# Patient Record
Sex: Male | Born: 1970 | Race: White | Hispanic: No | Marital: Married | Smoking: Never smoker
Health system: Southern US, Community
[De-identification: ages and names within clinical notes are randomized; demographics above are authoritative.]

## PROBLEM LIST (undated history)

## (undated) DIAGNOSIS — M542 Cervicalgia: Secondary | ICD-10-CM

## (undated) DIAGNOSIS — E7800 Pure hypercholesterolemia, unspecified: Secondary | ICD-10-CM

## (undated) DIAGNOSIS — F419 Anxiety disorder, unspecified: Secondary | ICD-10-CM

## (undated) DIAGNOSIS — F32A Depression, unspecified: Secondary | ICD-10-CM

## (undated) DIAGNOSIS — E119 Type 2 diabetes mellitus without complications: Secondary | ICD-10-CM

## (undated) DIAGNOSIS — I10 Essential (primary) hypertension: Secondary | ICD-10-CM

## (undated) HISTORY — PX: HAND SURGERY: SHX662

## (undated) HISTORY — PX: SHOULDER ARTHROSCOPY: SHX128

## (undated) HISTORY — PX: KNEE ARTHROSCOPY: SUR90

---

## 2008-11-09 ENCOUNTER — Ambulatory Visit: Payer: Self-pay | Admitting: Orthopedic Surgery

## 2013-04-28 ENCOUNTER — Ambulatory Visit: Payer: Self-pay | Admitting: Orthopedic Surgery

## 2014-12-13 ENCOUNTER — Ambulatory Visit: Payer: Self-pay | Admitting: Podiatry

## 2016-11-02 ENCOUNTER — Encounter: Payer: Self-pay | Admitting: Emergency Medicine

## 2016-11-02 ENCOUNTER — Emergency Department
Admission: EM | Admit: 2016-11-02 | Discharge: 2016-11-03 | Disposition: A | Discharge status: Home / Self Care | Payer: BLUE CROSS/BLUE SHIELD | Attending: Emergency Medicine | Admitting: Emergency Medicine

## 2016-11-02 ENCOUNTER — Emergency Department: Payer: BLUE CROSS/BLUE SHIELD

## 2016-11-02 DIAGNOSIS — Y999 Unspecified external cause status: Secondary | ICD-10-CM | POA: Insufficient documentation

## 2016-11-02 DIAGNOSIS — S60221A Contusion of right hand, initial encounter: Secondary | ICD-10-CM | POA: Insufficient documentation

## 2016-11-02 DIAGNOSIS — S63601A Unspecified sprain of right thumb, initial encounter: Secondary | ICD-10-CM | POA: Diagnosis not present

## 2016-11-02 DIAGNOSIS — S99911A Unspecified injury of right ankle, initial encounter: Secondary | ICD-10-CM | POA: Diagnosis present

## 2016-11-02 DIAGNOSIS — Y929 Unspecified place or not applicable: Secondary | ICD-10-CM | POA: Insufficient documentation

## 2016-11-02 DIAGNOSIS — X500XXA Overexertion from strenuous movement or load, initial encounter: Secondary | ICD-10-CM | POA: Insufficient documentation

## 2016-11-02 DIAGNOSIS — S82891A Other fracture of right lower leg, initial encounter for closed fracture: Secondary | ICD-10-CM

## 2016-11-02 DIAGNOSIS — S8254XA Nondisplaced fracture of medial malleolus of right tibia, initial encounter for closed fracture: Secondary | ICD-10-CM | POA: Diagnosis not present

## 2016-11-02 DIAGNOSIS — Y9389 Activity, other specified: Secondary | ICD-10-CM | POA: Diagnosis not present

## 2016-11-02 MED ORDER — TRAMADOL HCL 50 MG PO TABS
ORAL_TABLET | ORAL | Status: AC
Start: 2016-11-02 — End: 2016-11-03
  Administered 2016-11-03: 50 mg via ORAL
  Filled 2016-11-02: qty 1

## 2016-11-02 MED ORDER — TRAMADOL HCL 50 MG PO TABS
50.0000 mg | ORAL_TABLET | Freq: Once | ORAL | Status: AC
  Administered 2016-11-02: 50 mg via ORAL

## 2016-11-02 NOTE — ED Provider Notes (Signed)
[facility] Emergency Department Provider Note   ____________________________________________   I have reviewed the triage vital signs and the nursing notes.   HISTORY  Chief Complaint Hand Injury    HPI Vernon Burke is a 46 y.o. male presents with right thumb, hand and right ankle pain after breaking up a dog fight earlier this evening. Patient described his thumb hyperextending due to the weight of the dog. Once the fight ceased, he manually distracted his thumb although he denied noting it was dislocated. Patient noted while checking into the emergency department right anterior and medial ankle pain. He does not recall injury or twisting the ankle. Ambulatory at home and when he arrived at the emergency department.    History reviewed. No pertinent past medical history.  There are no active problems to display for this patient.   History reviewed. No pertinent surgical history.  Prior to Admission medications   Medication Sig Start Date End Date Taking? Authorizing Provider  traMADol (ULTRAM) 50 MG tablet Take 1 tablet (50 mg total) by mouth every 6 (six) hours as needed. 11/03/16 11/03/17  Tamecka Milham, Jordan Likesraci M, PA-C    Allergies Patient has no known allergies.  History reviewed. No pertinent family history.  Social History Social History  Substance Use Topics  . Smoking status: Never Smoker  . Smokeless tobacco: Never Used  . Alcohol use Yes    Review of Systems Constitutional: Negative for fever/chills Eyes: No visual changes. Cardiovascular: Denies chest pain. Respiratory: Denies shortness of breath. Gastrointestinal: No abdominal pain.  No nausea, vomiting, diarrhea. Genitourinary: Negative for dysuria. Musculoskeletal: Right thumb and hand pain. Right ankle pain. Skin: Negative for rash. Neurological: Negative for headaches.  Negative focal weakness or numbness. ____________________________________________   PHYSICAL  EXAM:  VITAL SIGNS: ED Triage Vitals  Enc Vitals Group     BP 11/02/16 2146 (!) 139/112     Pulse Rate 11/02/16 2146 90     Resp 11/02/16 2146 18     Temp 11/02/16 2146 98.1 F (36.7 C)     Temp Source 11/02/16 2146 Oral     SpO2 11/02/16 2146 96 %     Weight 11/02/16 2146 220 lb (99.8 kg)     Height 11/02/16 2146 6\' 1"  (1.854 m)     Head Circumference --      Peak Flow --      Pain Score 11/02/16 2145 9     Pain Loc --      Pain Edu? --      Excl. in GC? --     Constitutional: Alert and oriented. Well appearing and in no acute distress.  Head: Normocephalic and atraumatic. Eyes: Conjunctivae are normal. Cardiovascular: Normal rate, regular rhythm. Normal distal pulses. Respiratory: Normal respiratory effort.  Gastrointestinal: Soft and nontender. No distention. Musculoskeletal: Effusion along lateral hand. Right thumb pain, ROM limited by pain with sensation intact. No deformities noted. Right ankle pain, increases with weight-bearing. TTP along talus, navicular and medial malleoli. Right ankle ROM intact however with pain. Lateral ligamentous stability intact. Medial too painful for test.  Neurologic: Normal speech and language. No gross focal neurologic deficits are appreciated. Antalgic gait.  Skin:  Skin is warm, dry and intact. No rash noted. Psychiatric: Mood and affect are normal. Patient exhibits appropriate insight and judgment. ____________________________________________   LABS (all labs ordered are listed, but only abnormal results are displayed)  Labs Reviewed - No data to display ____________________________________________  EKG none  ____________________________________________  RADIOLOGY  DG right hand FINDINGS: No acute fracture or dislocation. Joint spaces maintained. Osseous mineralization normal. Punctate radiopaque density noted at the ulnar aspect of the right first proximal phalanx, which may reflect a small retained foreign body. Soft tissues  otherwise unremarkable.  IMPRESSION: 1. No acute fracture or dislocation. 2. Punctate radiopaque density within the soft tissues at the ulnar aspect of the right first proximal phalanx, which may reflect a tiny retained foreign body. Correlation with physical exam recommended.  DG right ankle  ____________________________________________   PROCEDURES  Procedure(s) performed: SPLINT APPLICATION Date/Time: 12:19 AM Authorized by: Clois Comber Consent: Verbal consent obtained. Risks and benefits: risks, benefits and alternatives were discussed Consent given by: patient Splint applied by: EMT technician Location details: right wrist Splint type: thumb spica velcro splint Supplies used: thumb spica wrist splint Post-procedure: The splinted body part was neurovascularly unchanged following the procedure. Patient tolerance: Patient tolerated the procedure well with no immediate complications.   Patient deferred splint for right ankle. He and his wife stated they would purchase OTC ankle brace until Orthopedic follow-up. Ace wrap applied for swelling management.    Critical Care performed: no ____________________________________________   INITIAL IMPRESSION / ASSESSMENT AND PLAN / ED COURSE  Pertinent labs & imaging results that were available during my care of the patient were reviewed by me and considered in my medical decision making (see chart for details).  Patient symptoms consistent with right thumb [place] and right ankle medial malleoli avulsion fracture following breaking up a dog fight. No neurological deficits noted in either injury. Physical exam findings and imaging confirmed injuries and supported management described. Right thumb sprain immobilized with a thumb spica splint. Neurovascular was intact following splint application. Discussed right ankle splint and support options with patient, he deferred splint and stated he would by an ankle brace over-the-counter.  Ace wrap applied for swelling management. Provided information on the type of ankle brace that would support injury until he followed up with orthopedics next week. Patient and wife verbalized understanding of the type of ankle brace needed for his injury. Recommended patient utilize prescription for pain management unless pain was less otherwise using over-the-counter NSAIDs and RICE for symptom management.   Patient / Family informed of clinical course, understand medical decision-making process, and agree with plan.  Patient was advised to follow up orthopedics and was also advised to return to the emergency department for symptoms that change or worsen.     ____________________________________________   FINAL CLINICAL IMPRESSION(S) / ED DIAGNOSES  Final diagnoses:  Contusion of right hand, initial encounter  Sprain of right thumb, unspecified site of finger, initial encounter  Avulsion fracture of right ankle, closed, initial encounter       NEW MEDICATIONS STARTED DURING THIS VISIT:  New Prescriptions   TRAMADOL (ULTRAM) 50 MG TABLET    Take 1 tablet (50 mg total) by mouth every 6 (six) hours as needed.     Note:  This document was prepared using Dragon voice recognition software and may include unintentional dictation errors.   Clois Comber, PA-C 11/03/16 0019    Darci Current, MD 11/04/16 1115

## 2016-11-02 NOTE — ED Triage Notes (Signed)
Pt ambulatory to triage in NAD, report tried to break up dog fight and right thumb got hyperextended.  No broken skin.

## 2016-11-03 MED ORDER — TRAMADOL HCL 50 MG PO TABS: 50.0000 mg | ORAL_TABLET | Freq: Four times a day (QID) | ORAL | 0 refills | Status: AC | PRN

## 2016-11-03 MED ORDER — TRAMADOL HCL 50 MG PO TABS
50.0000 mg | ORAL_TABLET | Freq: Once | ORAL | Status: AC
  Administered 2016-11-03: 50 mg via ORAL
  Filled 2016-11-03: qty 1

## 2016-11-03 NOTE — Discharge Instructions (Addendum)
If you have established care with Dr Rosita KeaMenz, call Monday to schedule an appointment.

## 2016-11-03 NOTE — ED Notes (Signed)
Pt's wife here to take pt home 

## 2018-01-15 ENCOUNTER — Encounter
Admission: RE | Admit: 2018-01-15 | Discharge: 2018-01-15 | Disposition: A | Discharge status: Home / Self Care | Payer: Worker's Compensation | Source: Ambulatory Visit | Attending: Orthopedic Surgery | Admitting: Orthopedic Surgery

## 2018-01-15 ENCOUNTER — Other Ambulatory Visit: Payer: Self-pay

## 2018-01-15 NOTE — Patient Instructions (Signed)
Your procedure is scheduled on:01/19/18 Report to Day Surgery. [facility]OND FLOOR To find out your arrival time please call [phone] between 1PM - 3PM on 01/16/18  Remember: Instructions that are not followed completely may result in serious medical risk,  up to and including death, or upon the discretion of your surgeon and anesthesiologist your  surgery may need to be rescheduled.     _X__ 1. Do not eat food after midnight the night before your procedure.                 No gum chewing or hard candies. You may drink clear liquids up to 2 hours                 before you are scheduled to arrive for your surgery- DO not drink clear                 liquids within 2 hours of the start of your surgery.                 Clear Liquids include:  water, apple juice without pulp, clear carbohydrate                 drink such as Clearfast of Gatorade, Black Coffee or Tea (Do not add                 anything to coffee or tea).  __X__2.  On the morning of surgery brush your teeth with toothpaste and water, you                may rinse your mouth with mouthwash if you wish.  Do not swallow any toothpaste of mouthwash.     _X__ 3.  No Alcohol for 24 hours before or after surgery.   _X__ 4.  Do Not Smoke or use e-cigarettes For 24 Hours Prior to Your Surgery.                 Do not use any chewable tobacco products for at least 6 hours prior to                 surgery.  ____  5.  Bring all medications with you on the day of surgery if instructed.   __X__  6.  Notify your doctor if there is any change in your medical condition      (cold, fever, infections).     Do not wear jewelry, make-up, hairpins, clips or nail polish. Do not wear lotions, powders, or perfumes. You may wear deodorant. Do not shave 48 hours prior to surgery. Men may shave face and neck. Do not bring valuables to the hospital.    [facility] is not responsible for any belongings or  valuables.  Contacts, dentures or bridgework may not be worn into surgery. Leave your suitcase in the car. After surgery it may be brought to your room. For patients admitted to the hospital, discharge time is determined by your treatment team.   Patients discharged the day of surgery will not be allowed to drive home.   Please read over the following fact sheets that you were given:   Surgical Site Infection Prevention    ____ Take these medicines the morning of surgery with A SIP OF WATER:    1. NONE  2.   3.   4.  5.  6.  ____ Fleet Enema (as directed)   ____ Use CHG Soap as directed  ____ Use inhalers on the day of surgery  ____ Stop metformin 2 days prior to surgery    ____ Take 1/2 of usual insulin dose the night before surgery. No insulin the morning          of surgery.   ____ Stop Coumadin/Plavix/aspirin on  _X___ Stop Anti-inflammatories on  NO ADVIL UNTIL AFTER SURGERY    CAN TAKE TYLENOL ____ Stop supplements until after surgery.    ____ Bring C-Pap to the hospital.

## 2018-01-18 MED ORDER — CEFAZOLIN SODIUM-DEXTROSE 2-4 GM/100ML-% IV SOLN
2.0000 g | Freq: Once | INTRAVENOUS | Status: AC
  Administered 2018-01-19: 2 g via INTRAVENOUS

## 2018-01-19 ENCOUNTER — Ambulatory Visit: Payer: Worker's Compensation | Admitting: Anesthesiology

## 2018-01-19 ENCOUNTER — Encounter
Admission: RE | Disposition: A | Discharge status: Home / Self Care | Payer: Self-pay | Source: Ambulatory Visit | Attending: Orthopedic Surgery

## 2018-01-19 ENCOUNTER — Encounter: Payer: Self-pay | Admitting: Emergency Medicine

## 2018-01-19 ENCOUNTER — Other Ambulatory Visit: Payer: Self-pay

## 2018-01-19 ENCOUNTER — Ambulatory Visit
Admission: RE | Admit: 2018-01-19 | Discharge: 2018-01-19 | Disposition: A | Discharge status: Home / Self Care | Payer: Worker's Compensation | Source: Ambulatory Visit | Attending: Orthopedic Surgery | Admitting: Orthopedic Surgery

## 2018-01-19 DIAGNOSIS — M7542 Impingement syndrome of left shoulder: Secondary | ICD-10-CM | POA: Insufficient documentation

## 2018-01-19 DIAGNOSIS — Y9389 Activity, other specified: Secondary | ICD-10-CM | POA: Insufficient documentation

## 2018-01-19 DIAGNOSIS — Z79891 Long term (current) use of opiate analgesic: Secondary | ICD-10-CM | POA: Diagnosis not present

## 2018-01-19 DIAGNOSIS — S46012A Strain of muscle(s) and tendon(s) of the rotator cuff of left shoulder, initial encounter: Secondary | ICD-10-CM | POA: Diagnosis not present

## 2018-01-19 DIAGNOSIS — X58XXXA Exposure to other specified factors, initial encounter: Secondary | ICD-10-CM | POA: Insufficient documentation

## 2018-01-19 DIAGNOSIS — M7522 Bicipital tendinitis, left shoulder: Secondary | ICD-10-CM | POA: Diagnosis not present

## 2018-01-19 DIAGNOSIS — Y990 Civilian activity done for income or pay: Secondary | ICD-10-CM | POA: Diagnosis not present

## 2018-01-19 DIAGNOSIS — Z791 Long term (current) use of non-steroidal anti-inflammatories (NSAID): Secondary | ICD-10-CM | POA: Diagnosis not present

## 2018-01-19 DIAGNOSIS — S43432A Superior glenoid labrum lesion of left shoulder, initial encounter: Secondary | ICD-10-CM | POA: Insufficient documentation

## 2018-01-19 DIAGNOSIS — M19012 Primary osteoarthritis, left shoulder: Secondary | ICD-10-CM | POA: Insufficient documentation

## 2018-01-19 HISTORY — PX: BICEPT TENODESIS: SHX5116

## 2018-01-19 SURGERY — SHOULDER ARTHROSCOPY WITH SUBACROMIAL DECOMPRESSION AND DISTAL CLAVICLE EXCISION
Anesthesia: General | Site: Shoulder | Laterality: Left | Wound class: Clean

## 2018-01-19 MED ORDER — BUPIVACAINE LIPOSOME 1.3 % IJ SUSP
INTRAMUSCULAR | Status: DC | PRN
  Administered 2018-01-19: 20 mL via PERINEURAL

## 2018-01-19 MED ORDER — PROPOFOL 10 MG/ML IV BOLUS
INTRAVENOUS | Status: AC
  Filled 2018-01-19: qty 20

## 2018-01-19 MED ORDER — ROCURONIUM BROMIDE 100 MG/10ML IV SOLN
INTRAVENOUS | Status: DC | PRN
  Administered 2018-01-19: 15 mg via INTRAVENOUS
  Administered 2018-01-19: 5 mg via INTRAVENOUS
  Administered 2018-01-19: 30 mg via INTRAVENOUS

## 2018-01-19 MED ORDER — FENTANYL CITRATE (PF) 100 MCG/2ML IJ SOLN
50.0000 ug | Freq: Once | INTRAMUSCULAR | Status: AC
  Administered 2018-01-19: 50 ug via INTRAVENOUS

## 2018-01-19 MED ORDER — NEOMYCIN-POLYMYXIN B GU 40-200000 IR SOLN
Status: DC | PRN
  Administered 2018-01-19: 2 mL

## 2018-01-19 MED ORDER — FAMOTIDINE 20 MG PO TABS
20.0000 mg | ORAL_TABLET | Freq: Once | ORAL | Status: AC
  Administered 2018-01-19: 20 mg via ORAL

## 2018-01-19 MED ORDER — MEPERIDINE HCL 50 MG/ML IJ SOLN: 6.2500 mg | INTRAMUSCULAR | Status: DC | PRN

## 2018-01-19 MED ORDER — FENTANYL CITRATE (PF) 100 MCG/2ML IJ SOLN
INTRAMUSCULAR | Status: DC | PRN
  Administered 2018-01-19 (×2): 50 ug via INTRAVENOUS
  Administered 2018-01-19: 100 ug via INTRAVENOUS

## 2018-01-19 MED ORDER — BUPIVACAINE HCL (PF) 0.5 % IJ SOLN
INTRAMUSCULAR | Status: AC
  Filled 2018-01-19: qty 10

## 2018-01-19 MED ORDER — FENTANYL CITRATE (PF) 100 MCG/2ML IJ SOLN
INTRAMUSCULAR | Status: AC
  Filled 2018-01-19: qty 2

## 2018-01-19 MED ORDER — OXYCODONE HCL 5 MG PO TABS: 5.0000 mg | ORAL_TABLET | ORAL | 0 refills | Status: DC | PRN

## 2018-01-19 MED ORDER — LIDOCAINE HCL (PF) 1 % IJ SOLN
INTRAMUSCULAR | Status: AC
  Filled 2018-01-19: qty 30

## 2018-01-19 MED ORDER — ONDANSETRON HCL 4 MG/2ML IJ SOLN
INTRAMUSCULAR | Status: AC
  Filled 2018-01-19: qty 2

## 2018-01-19 MED ORDER — LIDOCAINE HCL (PF) 2 % IJ SOLN
INTRAMUSCULAR | Status: AC
  Filled 2018-01-19: qty 10

## 2018-01-19 MED ORDER — ACETAMINOPHEN 500 MG PO TABS: 1000.0000 mg | ORAL_TABLET | Freq: Three times a day (TID) | ORAL | 2 refills | Status: DC

## 2018-01-19 MED ORDER — SODIUM CHLORIDE FLUSH 0.9 % IV SOLN
INTRAVENOUS | Status: AC
  Filled 2018-01-19: qty 10

## 2018-01-19 MED ORDER — LIDOCAINE HCL (CARDIAC) PF 100 MG/5ML IV SOSY
PREFILLED_SYRINGE | INTRAVENOUS | Status: DC | PRN
  Administered 2018-01-19: 100 mg via INTRAVENOUS

## 2018-01-19 MED ORDER — LIDOCAINE HCL (PF) 1 % IJ SOLN
INTRAMUSCULAR | Status: DC | PRN
  Administered 2018-01-19: 3 mL

## 2018-01-19 MED ORDER — MIDAZOLAM HCL 2 MG/2ML IJ SOLN
1.0000 mg | Freq: Once | INTRAMUSCULAR | Status: AC
  Administered 2018-01-19: 1 mg via INTRAVENOUS

## 2018-01-19 MED ORDER — LIDOCAINE HCL (PF) 1 % IJ SOLN
INTRAMUSCULAR | Status: AC
  Filled 2018-01-19: qty 5

## 2018-01-19 MED ORDER — SUCCINYLCHOLINE CHLORIDE 20 MG/ML IJ SOLN
INTRAMUSCULAR | Status: DC | PRN
  Administered 2018-01-19: 100 mg via INTRAVENOUS

## 2018-01-19 MED ORDER — ONDANSETRON HCL 4 MG/2ML IJ SOLN
INTRAMUSCULAR | Status: DC | PRN
  Administered 2018-01-19: 4 mg via INTRAVENOUS

## 2018-01-19 MED ORDER — LACTATED RINGERS IV SOLN
INTRAVENOUS | Status: DC | PRN
  Administered 2018-01-19: 5 mL

## 2018-01-19 MED ORDER — LIDOCAINE HCL (PF) 1 % IJ SOLN
INTRAMUSCULAR | Status: DC | PRN
  Administered 2018-01-19: 10 mL

## 2018-01-19 MED ORDER — BUPIVACAINE-EPINEPHRINE (PF) 0.5% -1:200000 IJ SOLN
INTRAMUSCULAR | Status: DC | PRN
  Administered 2018-01-19: 3 mL

## 2018-01-19 MED ORDER — PROMETHAZINE HCL 25 MG/ML IJ SOLN
INTRAMUSCULAR | Status: AC
  Administered 2018-01-19: 12.5 mg via INTRAVENOUS
  Filled 2018-01-19: qty 1

## 2018-01-19 MED ORDER — SUGAMMADEX SODIUM 200 MG/2ML IV SOLN
INTRAVENOUS | Status: AC
  Filled 2018-01-19: qty 2

## 2018-01-19 MED ORDER — BUPIVACAINE LIPOSOME 1.3 % IJ SUSP
INTRAMUSCULAR | Status: AC
  Filled 2018-01-19: qty 20

## 2018-01-19 MED ORDER — SUGAMMADEX SODIUM 200 MG/2ML IV SOLN
INTRAVENOUS | Status: DC | PRN
  Administered 2018-01-19: 200 mg via INTRAVENOUS

## 2018-01-19 MED ORDER — PROPOFOL 10 MG/ML IV BOLUS
INTRAVENOUS | Status: DC | PRN
  Administered 2018-01-19: 200 mg via INTRAVENOUS

## 2018-01-19 MED ORDER — ASPIRIN EC 325 MG PO TBEC: 325.0000 mg | DELAYED_RELEASE_TABLET | Freq: Every day | ORAL | 0 refills | Status: AC

## 2018-01-19 MED ORDER — CEFAZOLIN SODIUM-DEXTROSE 2-4 GM/100ML-% IV SOLN
INTRAVENOUS | Status: AC
  Filled 2018-01-19: qty 100

## 2018-01-19 MED ORDER — ROCURONIUM BROMIDE 50 MG/5ML IV SOLN
INTRAVENOUS | Status: AC
  Filled 2018-01-19: qty 1

## 2018-01-19 MED ORDER — BUPIVACAINE HCL (PF) 0.5 % IJ SOLN
INTRAMUSCULAR | Status: DC | PRN
  Administered 2018-01-19: 10 mL via PERINEURAL

## 2018-01-19 MED ORDER — OXYCODONE HCL 5 MG PO TABS: 5.0000 mg | ORAL_TABLET | Freq: Once | ORAL | Status: DC | PRN

## 2018-01-19 MED ORDER — FENTANYL CITRATE (PF) 100 MCG/2ML IJ SOLN
INTRAMUSCULAR | Status: AC
  Administered 2018-01-19: 50 ug via INTRAVENOUS
  Filled 2018-01-19: qty 2

## 2018-01-19 MED ORDER — NEOMYCIN-POLYMYXIN B GU 40-200000 IR SOLN
Status: AC
  Filled 2018-01-19: qty 2

## 2018-01-19 MED ORDER — LACTATED RINGERS IV SOLN
INTRAVENOUS | Status: DC
  Administered 2018-01-19: 07:00:00 via INTRAVENOUS

## 2018-01-19 MED ORDER — EPINEPHRINE 30 MG/30ML IJ SOLN
INTRAMUSCULAR | Status: AC
  Filled 2018-01-19: qty 1

## 2018-01-19 MED ORDER — PROMETHAZINE HCL 25 MG/ML IJ SOLN
6.2500 mg | INTRAMUSCULAR | Status: DC | PRN
  Administered 2018-01-19: 12.5 mg via INTRAVENOUS

## 2018-01-19 MED ORDER — OXYCODONE HCL 5 MG/5ML PO SOLN: 5.0000 mg | Freq: Once | ORAL | Status: DC | PRN

## 2018-01-19 MED ORDER — FAMOTIDINE 20 MG PO TABS
ORAL_TABLET | ORAL | Status: AC
  Administered 2018-01-19: 20 mg via ORAL
  Filled 2018-01-19: qty 1

## 2018-01-19 MED ORDER — BUPIVACAINE-EPINEPHRINE (PF) 0.5% -1:200000 IJ SOLN
INTRAMUSCULAR | Status: AC
  Filled 2018-01-19: qty 30

## 2018-01-19 MED ORDER — MIDAZOLAM HCL 2 MG/2ML IJ SOLN
INTRAMUSCULAR | Status: AC
  Administered 2018-01-19: 1 mg via INTRAVENOUS
  Filled 2018-01-19: qty 2

## 2018-01-19 MED ORDER — ONDANSETRON 4 MG PO TBDP: 4.0000 mg | ORAL_TABLET | Freq: Three times a day (TID) | ORAL | 0 refills | Status: DC | PRN

## 2018-01-19 MED ORDER — SUCCINYLCHOLINE CHLORIDE 20 MG/ML IJ SOLN
INTRAMUSCULAR | Status: AC
  Filled 2018-01-19: qty 1

## 2018-01-19 MED ORDER — PHENYLEPHRINE HCL 10 MG/ML IJ SOLN
INTRAMUSCULAR | Status: AC
  Filled 2018-01-19: qty 1

## 2018-01-19 MED ORDER — FENTANYL CITRATE (PF) 100 MCG/2ML IJ SOLN: 25.0000 ug | INTRAMUSCULAR | Status: DC | PRN

## 2018-01-19 SURGICAL SUPPLY — 94 items
ADAPTER IRRIG TUBE 2 SPIKE SOL (ADAPTER) ×4 IMPLANT
ADH SKN CLS APL DERMABOND .7 (GAUZE/BANDAGES/DRESSINGS) ×2
ADPR TBG 2 SPK PMP STRL ASCP (ADAPTER)
ADPR TBG Y 2 SPK STRL DISP CNT (ADAPTER) ×2
ANCH SUT Q-FX 2.8 (Anchor) ×2 IMPLANT
ANCH SUT RGNRT REGENETEN (Staple) ×2 IMPLANT
ANCHOR ALL-SUT Q-FIX 2.8 (Anchor) ×2 IMPLANT
ANCHOR TENDON REGENETEN (Staple) ×2 IMPLANT
ANCHORS BONE REGENETEN (Anchor) ×2 IMPLANT
BLADE SURG 15 STRL LF DISP TIS (BLADE) IMPLANT
BLADE SURG 15 STRL SS (BLADE) ×4
BRUSH SCRUB EZ  4% CHG (MISCELLANEOUS)
BRUSH SCRUB EZ 4% CHG (MISCELLANEOUS) ×2 IMPLANT
BUR RADIUS 4.0X18.5 (BURR) ×2 IMPLANT
BUR RADIUS 5.5 (BURR) ×2 IMPLANT
BURR OVAL 8 FLU 5.0MM X 13CM (MISCELLANEOUS) ×1
BURR OVAL 8 FLU 5.0X13 (MISCELLANEOUS) ×1 IMPLANT
Burr Oval, 8 Flute ×2 IMPLANT
CANNULA 5.75X7 CRYSTAL CLEAR (CANNULA) ×4 IMPLANT
CANNULA PARTIAL THREAD 2X7 (CANNULA) ×4 IMPLANT
CANNULA TWIST IN 8.25X9CM (CANNULA) IMPLANT
CHLORAPREP W/TINT 26ML (MISCELLANEOUS) ×6 IMPLANT
CLOSURE WOUND 1/2 X4 (GAUZE/BANDAGES/DRESSINGS)
COOLER POLAR GLACIER W/PUMP (MISCELLANEOUS) ×4 IMPLANT
CRADLE LAMINECT ARM (MISCELLANEOUS) ×4 IMPLANT
CUTTER BONE 4.0MM X 13CM (MISCELLANEOUS) ×2 IMPLANT
DERMABOND ADVANCED (GAUZE/BANDAGES/DRESSINGS) ×2
DERMABOND ADVANCED .7 DNX12 (GAUZE/BANDAGES/DRESSINGS) IMPLANT
DRAPE IMP U-DRAPE 54X76 (DRAPES) ×8 IMPLANT
DRAPE INCISE IOBAN 66X45 STRL (DRAPES) ×4 IMPLANT
DRAPE SHEET LG 3/4 BI-LAMINATE (DRAPES) ×4 IMPLANT
DRAPE STERI 35X30 U-POUCH (DRAPES) ×4 IMPLANT
DRSG TEGADERM 4X4.75 (GAUZE/BANDAGES/DRESSINGS) ×6 IMPLANT
DW OUTFLOW CASSETTE/TUBE SET (MISCELLANEOUS) ×2 IMPLANT
ELECT REM PT RETURN 9FT ADLT (ELECTROSURGICAL)
ELECTRODE REM PT RTRN 9FT ADLT (ELECTROSURGICAL) IMPLANT
GAUZE PETRO XEROFOAM 1X8 (MISCELLANEOUS) ×2 IMPLANT
GAUZE SPONGE 4X4 12PLY STRL (GAUZE/BANDAGES/DRESSINGS) ×4 IMPLANT
GAUZE XEROFORM 4X4 STRL (GAUZE/BANDAGES/DRESSINGS) ×2 IMPLANT
GLOVE BIOGEL PI IND STRL 8 (GLOVE) ×2 IMPLANT
GLOVE BIOGEL PI INDICATOR 8 (GLOVE) ×6
GLOVE SURG ORTHO 8.0 STRL STRW (GLOVE) ×4 IMPLANT
GLOVE SURG SYN 7.5  E (GLOVE) ×2
GLOVE SURG SYN 7.5 E (GLOVE) ×2 IMPLANT
GLOVE SURG SYN 7.5 PF PI (GLOVE) ×2 IMPLANT
GOWN STRL REUS W/ TWL LRG LVL3 (GOWN DISPOSABLE) ×2 IMPLANT
GOWN STRL REUS W/TWL LRG LVL3 (GOWN DISPOSABLE) ×4
GOWN STRL REUS W/TWL LRG LVL4 (GOWN DISPOSABLE) ×10 IMPLANT
IMPLANT REGENETEN MEDIUM (Shoulder) ×2 IMPLANT
IV LACTATED RINGER IRRG 3000ML (IV SOLUTION) ×20
IV LR IRRIG 3000ML ARTHROMATIC (IV SOLUTION) ×8 IMPLANT
KIT STABILIZATION SHOULDER (MISCELLANEOUS) ×4 IMPLANT
KIT SUTURE 2.8 Q-FIX DISP (MISCELLANEOUS) ×2 IMPLANT
KIT SUTURETAK 3.0 INSERT PERC (KITS) ×2 IMPLANT
KIT TURNOVER KIT A (KITS) ×4 IMPLANT
MANIFOLD NEPTUNE II (INSTRUMENTS) ×4 IMPLANT
MASK FACE SPIDER DISP (MASK) ×4 IMPLANT
MAT BLUE FLOOR 46X72 FLO (MISCELLANEOUS) ×8 IMPLANT
NDL MAYO CATGUT SZ5 (NEEDLE) ×4
NDL SAFETY ECLIPSE 18X1.5 (NEEDLE) ×2 IMPLANT
NDL SUT 5 .5 CRC TPR PNT MAYO (NEEDLE) IMPLANT
NEEDLE HYPO 18GX1.5 SHARP (NEEDLE) ×4
NEEDLE HYPO 22GX1.5 SAFETY (NEEDLE) ×4 IMPLANT
PACK ARTHROSCOPY SHOULDER (MISCELLANEOUS) ×4 IMPLANT
PAD ABD DERMACEA PRESS 5X9 (GAUZE/BANDAGES/DRESSINGS) ×4 IMPLANT
PAD WRAPON POLAR SHDR XLG (MISCELLANEOUS) ×2 IMPLANT
PROBE APOLLO 90XL (SURGICAL WAND) ×2 IMPLANT
SET ADAPTER IRRIG ARTHO 72IN Y (ADAPTER) ×2 IMPLANT
SET TUBE SUCT SHAVER OUTFL 24K (TUBING) ×2 IMPLANT
SET TUBE TIP INTRA-ARTICULAR (MISCELLANEOUS) ×2 IMPLANT
SLING ULTRA II M (MISCELLANEOUS) ×4 IMPLANT
STRAP SAFETY 5IN WIDE (MISCELLANEOUS) ×4 IMPLANT
STRIP CLOSURE SKIN 1/2X4 (GAUZE/BANDAGES/DRESSINGS) ×2 IMPLANT
SUT ETHILON 3-0 FS-10 30 BLK (SUTURE) ×4
SUT ETHILON 4-0 (SUTURE) ×4
SUT ETHILON 4-0 FS2 18XMFL BLK (SUTURE) ×2
SUT LASSO 90 DEG SD STR (SUTURE) ×2 IMPLANT
SUT MNCRL 4-0 (SUTURE) ×4
SUT MNCRL 4-0 27XMFL (SUTURE) ×2
SUT PDS AB 0 CT1 27 (SUTURE) ×4 IMPLANT
SUTURE EHLN 3-0 FS-10 30 BLK (SUTURE) IMPLANT
SUTURE ETHLN 4-0 FS2 18XMF BLK (SUTURE) ×2 IMPLANT
SUTURE MNCRL 4-0 27XMF (SUTURE) ×2 IMPLANT
SYR 10ML LL (SYRINGE) ×4 IMPLANT
TAPE CLOTH 3X10 WHT NS LF (GAUZE/BANDAGES/DRESSINGS) ×4 IMPLANT
TAPE MICROFOAM 4IN (TAPE) ×4 IMPLANT
TUBING ARTHRO INFLOW-ONLY STRL (TUBING) ×2 IMPLANT
TUBING CONNECTING 10 (TUBING) ×3 IMPLANT
TUBING CONNECTING 10' (TUBING) ×1
TUBING REDEUCE PAT W/CON 8IN (MISCELLANEOUS) ×2 IMPLANT
TUBING REDEUCE PUMP W/CON 8IN (MISCELLANEOUS) ×2 IMPLANT
WAND HAND CNTRL MULTIVAC 90 (MISCELLANEOUS) IMPLANT
WRAPON POLAR PAD SHDR XLG (MISCELLANEOUS) ×4
burr oval 8 f ×1 IMPLANT

## 2018-01-19 NOTE — Anesthesia Procedure Notes (Signed)
Procedure Name: Intubation Performed by: Casey BurkittHoang, Nylene Inlow, CRNA Pre-anesthesia Checklist: Patient identified, Patient being monitored, Timeout performed, Emergency Drugs available and Suction available Patient Re-evaluated:Patient Re-evaluated prior to induction Oxygen Delivery Method: Circle system utilized Preoxygenation: Pre-oxygenation with 100% oxygen Induction Type: IV induction Ventilation: Mask ventilation without difficulty, Oral airway inserted - appropriate to patient size and Two handed mask ventilation required Laryngoscope Size: 3 and McGraph Grade View: Grade I Tube type: Oral Tube size: 7.5 mm Number of attempts: 1 Airway Equipment and Method: Stylet Placement Confirmation: ETT inserted through vocal cords under direct vision,  positive ETCO2 and breath sounds checked- equal and bilateral Secured at: 22 cm Tube secured with: Tape Dental Injury: Teeth and Oropharynx as per pre-operative assessment

## 2018-01-19 NOTE — Op Note (Signed)
SURGERY DATE: 01/19/2018  PRE-OP DIAGNOSIS:  1. Left subacromial impingement 2. Left biceps tendinopathy 3. Left partial thickness rotator cuff tear 4. Left acromioclavicular joint osteoarthritis  POST-OP DIAGNOSIS: 1. Left subacromial impingement 2. Left biceps tendinopathy 3. Left rotator cuff tear 4. Left acromioclavicular joint osteoarthritis  PROCEDURES:  1. Left mini-open rotator cuff repair with Regeneten patch 2. Left open biceps tenodesis 3. Left distal clavicle excision 4. Left extensive debridement of shoulder (glenohumeral and subacromial spaces) 5. Left subacromial decompression  SURGEON: Rosealee Albee, MD  ANESTHESIA: Gen with interscalene block w/Exparil  ESTIMATED BLOOD LOSS: 25cc  DRAINS:  none  TOTAL IV FLUIDS: per anesthesia   SPECIMENS: none  IMPLANTS:  - [employer] Regeneten patch with associated tendon and bone staples - [employer] Q-fix all-suture Anchor - x1  OPERATIVE FINDINGS:  Examination under anesthesia: A careful examination under anesthesia was performed.  Passive range of motion was: FF: 150; ER at side: 50; ER in abduction: 90; IR in abduction: 50.  Anterior load shift: NT.  Posterior load shift: NT.  Sulcus in neutral: NT.  Sulcus in ER: NT.    Intra-operative findings: A thorough arthroscopic examination of the shoulder was performed.  The findings are: 1. Biceps tendon: tendinopathy with erythema 2. Superior labrum: Type 2 SLAP tear 3. Posterior labrum and capsule: normal 4. Inferior capsule and inferior recess: normal 5. Glenoid cartilage surface: Normal 6. Supraspinatus attachment: partial thickness tearing of entire anterior to posterior aspect of supraspinatus involving ~50% of the articular surface 7. Posterior rotator cuff attachment: normal 8. Humeral head articular cartilage: normal 9. Rotator interval: normal 10: Subscapularis tendon: slight partial thickness tearing of superior fibers, no exposed lesser  tuberosity footprint 11. Anterior labrum: degenerative 12. IGHL: normal  OPERATIVE REPORT:   Indications for procedure: Vernon Burke is a 48 y.o. year old male with ~5 months of shoulder pain that began after he tried catching a heavy falling object at work. He developed adhesive capsulitis that has been treated appropriately. He has has failed nonoperative management including rest, activity modification, physical therapy, and/or corticosteroid injection. MRI showed a partial thickness rotator cuff tear with significant AC joint osteolysis. After discussion of risks, benefits, and alternatives to surgery, the patient elected to proceed with above mentioned procedure. The patient understands that use of the Regeneten patch is relatively new and long-term data is unknown.  Procedure in detail:  I identified WELTON BORD in the pre-operative holding area.  I marked the operative shoulder with my initials. I reviewed the risks and benefits of the proposed surgical intervention, and the patient (and/or patient's guardian) wished to proceed.  Anesthesia was then performed with an interscalene block with Exparil.  The patient was transferred to the operative suite and placed in the beach chair position.    SCDs were placed on the lower extremities. Appropriate IV antibiotics were administered. The operative upper extremity was then prepped and draped in standard fashion. A time out was performed confirming the correct extremity, correct patient and correct procedure.   I then created a standard posterior portal with an 11 blade. The glenohumeral joint was easily entered with a blunt trochar and the arthroscope introduced. The findings of diagnostic arthroscopy are described above. I debrided the degenerative anterior labrum and also debrided and coagulated the inflamed synovium to obtain hemostasis and reduce the risk of post-operative swelling using an Arthrocare radiofrequency device. A  spinal needle was placed in the anterior portion of the partial thickness  rotator cuff tear and an 0-PDS suture was passed through the needle and retrieved out of the anterior portal. This was repeated to mark the posterior position of the partial thickness rotator cuff tear.  I performed a biceps tenotomy using an arthroscopic scissors and used a motorized shaver to debride the stump back to a stable base.   Next, the arthroscope was then introduced into the subacromial space. A direct lateral portal was created with an 11-blade after spinal needle localization. An extensive subacromial bursectomy was performed using a combination of the shaver and Arthrocare wand. The entire acromial undersurface was exposed and the CA ligament was subperiosteally elevated to expose the prominent anterior acromial hook. A burr was used to create a flat anterior and lateral aspect of the acromion, converting it from a Type 2 to a Type 1 acromion. Care was made to keep the deltoid fascia intact.  Next, I exposed the acromioclavicular joint using a combination of shaver and arthrocare wand. The distal 66m of clavicle was removed using a 5.61mm burr (2 burr widths removed). Adequate resection was confirmed by placing the camera into the anterior portal. Care was taken to preserve the superior and posterior capsule. Hemostasis was achieved and fluid was evacuated from the shoulder.   A longitudinal incision from the anterolateral acromion ~6cm in length was made overlying the raphe between the anterior and middle heads of the deltoid. The raphe was identified and it was incised. The subacromial space was identified. Any remaining bursa was excised. We then turned our attention to the biceps tenodesis. The arm was externally rotated.  The bicipital groove was identified.  A 15 blade was used to make a cut overlying the biceps tendon, and the tendon was removed using a right angle clamp.  The base of the bicipital groove was  identified and cleared of soft tissue.  A Q fix anchor was placed in the bicipital groove.  The biceps tendon was held at the appropriate amount of tension.  One set of sutures was passed through the biceps anchor with one limb passed in a simple fashion and the second limb passed in a simple plus locking stitch pattern. This was repeated for the other set of sutures.  This construct allowed for shuttling the biceps tendon down to the bone.  The sutures were tied and cut.  The diseased portion of the proximal biceps was then excised.  The arm was then internally rotated.  The bursal side of the rotator cuff was intact except a small area of partial thickness tearing along the anterior supraspinatus. There was no full-thickness tear. The 0-PDS sutures marking the anterior and posterior medial aspects of the rotator cuff tear were identified. We decided to proceed with Regeneten patch placement. The Regeneten patch delivery gun was placed appropriately and the patch was delivered over the supraspinatus tendon. It was positioned such that both 0-PDS sutures were covered and the bursal sided partial-thickness rotator cuff tear were all covered. Tendon staples were placed medially, anteriorly, and posteriorly. Two bone staples was then placed laterally. The patch was then probed to confirm appropriate stability.   The wound was thoroughly irrigated.  The deltoid split was closed with 0 Vicryl.  The subdermal layer was closed with 2-0 Vicryl.  The skin was closed with 4-0 Monocryl and Dermabond. The portals were closed with 3-0 Nylon. Xeroform was applied to the portals. A sterile dressing was applied, followed by a Polar Care sleeve and a SlingShot shoulder immobilizer/sling. The patient awoke  from anesthesia without difficulty and was transferred to the PACU in stable condition.    COMPLICATIONS: none  DISPOSITION: plan for discharge home after recovery in PACU  POSTOPERATIVE PLAN: Remain in sling (except  hygiene and elbow/wrist/hand RoM exercises as instructed by PT) x 4 weeks and NWB for this time. PT to begin 3-4 days after surgery. Use biceps tenodesis and distal clavicle excision protocol as Regeneten patch protocol is too aggressive for biceps tenodesis healing.

## 2018-01-19 NOTE — Anesthesia Preprocedure Evaluation (Signed)
Anesthesia Evaluation  Patient identified by MRN, date of birth, ID band Patient awake    Reviewed: Allergy & Precautions, NPO status , Patient's Chart, lab work & pertinent test results  History of Anesthesia Complications Negative for: history of anesthetic complications  Airway Mallampati: II  TM Distance: >3 FB Neck ROM: Full    Dental  (+) Poor Dentition,    Pulmonary neg pulmonary ROS, neg sleep apnea, neg COPD,    breath sounds clear to auscultation- rhonchi (-) wheezing      Cardiovascular Exercise Tolerance: Good (-) hypertension(-) CAD and (-) Past MI  Rhythm:Regular Rate:Normal - Systolic murmurs and - Diastolic murmurs    Neuro/Psych negative neurological ROS  negative psych ROS   GI/Hepatic negative GI ROS, Neg liver ROS,   Endo/Other  negative endocrine ROSneg diabetes  Renal/GU negative Renal ROS     Musculoskeletal negative musculoskeletal ROS (+)   Abdominal (+) - obese,   Peds  Hematology negative hematology ROS (+)   Anesthesia Other Findings    Reproductive/Obstetrics                             Anesthesia Physical Anesthesia Plan  ASA: I  Anesthesia Plan: General   Post-op Pain Management:  Regional for Post-op pain   Induction: Intravenous  PONV Risk Score and Plan: 1 and Ondansetron and Midazolam  Airway Management Planned: Oral ETT  Additional Equipment:   Intra-op Plan:   Post-operative Plan: Extubation in OR  Informed Consent: I have reviewed the patients History and Physical, chart, labs and discussed the procedure including the risks, benefits and alternatives for the proposed anesthesia with the patient or authorized representative who has indicated his/her understanding and acceptance.   Dental advisory given  Plan Discussed with: CRNA and Anesthesiologist  Anesthesia Plan Comments:         Anesthesia Quick Evaluation

## 2018-01-19 NOTE — Transfer of Care (Signed)
Immediate Anesthesia Transfer of Care Note  Patient: Vernon LincolnJeffrey B Burke  Procedure(s) Performed: SHOULDER ARTHROSCOPY WITH SUBACROMIAL DECOMPRESSION, DISTAL CLAVICLE EXCISION,OPEN ROTATOR CUFF REPAIR AND REGENETEN PATCH APPLICATION. (Left Shoulder) BICEPS TENODESIS (Left )  Patient Location: PACU  Anesthesia Type:General  Level of Consciousness: sedated and responds to stimulation  Airway & Oxygen Therapy: Patient Spontanous Breathing and Patient connected to face mask oxygen  Post-op Assessment: Report given to RN and Post -op Vital signs reviewed and stable  Post vital signs: Reviewed and stable  Last Vitals:  Vitals Value Taken Time  BP 146/90 01/19/2018 11:26 AM  Temp 36.1 C 01/19/2018 11:24 AM  Pulse 86 01/19/2018 11:26 AM  Resp 23 01/19/2018 11:26 AM  SpO2 99 % 01/19/2018 11:26 AM    Last Pain:  Vitals:   01/19/18 0609  TempSrc: Tympanic  PainSc: 4          Complications: No apparent anesthesia complications

## 2018-01-19 NOTE — Anesthesia Post-op Follow-up Note (Signed)
Anesthesia QCDR form completed.        

## 2018-01-19 NOTE — Anesthesia Postprocedure Evaluation (Signed)
Anesthesia Post Note  Patient: Vernon Burke  Procedure(s) Performed: SHOULDER ARTHROSCOPY WITH SUBACROMIAL DECOMPRESSION, DISTAL CLAVICLE EXCISION,OPEN ROTATOR CUFF REPAIR AND REGENETEN PATCH APPLICATION. (Left Shoulder) BICEPS TENODESIS (Left )  Patient location during evaluation: PACU Anesthesia Type: General Level of consciousness: awake and alert and oriented Pain management: pain level controlled Vital Signs Assessment: post-procedure vital signs reviewed and stable Respiratory status: spontaneous breathing, nonlabored ventilation and respiratory function stable Cardiovascular status: blood pressure returned to baseline and stable Postop Assessment: no signs of nausea or vomiting Anesthetic complications: no     Last Vitals:  Vitals:   01/19/18 1254 01/19/18 1315  BP: (!) 132/91 (!) 149/93  Pulse: 87 81  Resp: 20 16  Temp: 36.6 C (!) 36.3 C  SpO2: 98% 100%    Last Pain:  Vitals:   01/19/18 1315  TempSrc: Temporal  PainSc: 0-No pain                 Alexa Blish

## 2018-01-19 NOTE — Anesthesia Procedure Notes (Signed)
Anesthesia Regional Block: Interscalene brachial plexus block   Pre-Anesthetic Checklist: ,, timeout performed, Correct Patient, Correct Site, Correct Laterality, Correct Procedure, Correct Position, site marked, Risks and benefits discussed,  Surgical consent,  Pre-op evaluation,  At surgeon's request and post-op pain management  Laterality: Left  Prep: chloraprep       Needles:  Injection technique: Single-shot  Needle Type: Stimiplex     Needle Length: 10cm  Needle Gauge: 21     Additional Needles:   Procedures:,,,, ultrasound used (permanent image in chart),,,,  Narrative:  Start time: 01/19/2018 7:15 AM End time: 01/19/2018 7:23 AM Injection made incrementally with aspirations every 5 mL.  Performed by: Personally  Anesthesiologist: Alver FisherPenwarden, Lalla Laham, MD  Additional Notes: Functioning IV was confirmed and monitors were applied.  A Stimuplex needle was used. Sterile prep and drape,hand hygiene and sterile gloves were used.  Negative aspiration and negative test dose prior to incremental administration of local anesthetic. The patient tolerated the procedure well.

## 2018-01-19 NOTE — Discharge Instructions (Addendum)
Post-Op Instructions - Regeneten Patch/ Biceps Tenodesis  1. Bracing: You will wear a shoulder immobilizer or sling for 4 weeks.   2. Driving: No driving for 4 weeks post-op.  3. Activity: Wrist, hand, and elbow motion only. Avoid lifting the upper arm away from the body except for hygiene. You are permitted to bend and straighten the elbow passively only (no active elbow motion). You may use your hand and wrist for typing, writing, and managing utensils (cutting food). When sleeping or resting, inclined positions (recliner chair or wedge pillow) and a pillow under the forearm for support may provide better comfort for up to 4 weeks.  Avoid long distance travel for 4 weeks.  Return to normal activities after rotator cuff repair repair normally takes 6 months on average. If rehab goes very well, may be able to do most activities at 4 months, except overhead or contact sports.  4. Physical Therapy: Begins 3-4 days after surgery, and proceed 1 time per week for the first 6 weeks, then 1-2 times per week from weeks 6-20 post-op.  5. Medications:  - You will be provided a prescription for narcotic pain medicine. After surgery, take 1-2 narcotic tablets every 4 hours if needed for severe pain.  - A prescription for anti-nausea medication will be provided in case the narcotic medicine causes nausea - take 1 tablet every 6 hours only if nauseated.   - Take tylenol 1000 mg (2 Extra Strength tablets or 3 regular strength) every 8 hours for pain.  May decrease or stop tylenol 5 days after surgery if you are having minimal pain. - Take ASA 325mg /day x 2 weeks to help prevent DVTs/PEs (blood clots).  - DO NOT take ANY nonsteroidal anti-inflammatory pain medications (Advil, Motrin, Ibuprofen, Aleve, Naproxen, or Naprosyn). These medicines can inhibit healing of your shoulder repair.    If you are taking prescription medication for anxiety, depression, insomnia, muscle spasm, chronic pain, or for attention  deficit disorder, you are advised that you are at a higher risk of adverse effects with use of narcotics post-op, including narcotic addiction/dependence, depressed breathing, death. If you use non-prescribed substances: alcohol, marijuana, cocaine, heroin, methamphetamines, etc., you are at a higher risk of adverse effects with use of narcotics post-op, including narcotic addiction/dependence, depressed breathing, death. You are advised that taking > 50 morphine milligram equivalents (MME) of narcotic pain medication per day results in twice the risk of overdose or death. For your prescription provided: oxycodone 5 mg - taking more than 6 tablets per day would result in > 50 morphine milligram equivalents (MME) of narcotic pain medication. Be advised that we will prescribe narcotics short-term, for acute post-operative pain only - 3 weeks for major operations such as shoulder repair/reconstruction surgeries.     6. Post-Op Appointment:  Your first post-op appointment will be 10-14 days post-op.  7. Work or School: For most, but not all procedures, we advise staying out of work or school for at least 1 to 2 weeks in order to recover from the stress of surgery and to allow time for healing.   If you need a work or school note this can be provided.   8. Smoking: If you are a smoker, you need to refrain from smoking in the postoperative period. The nicotine in cigarettes will inhibit healing of your shoulder repair and decrease the chance of successful repair. Similarly, nicotine containing products (gum, patches) should be avoided.   Post-operative Brace: Apply and remove the brace you received as  you were instructed to at the time of fitting and as described in detail as the braces instructions for use indicate.  Wear the brace for the period of time prescribed by your physician.  The brace can be cleaned with soap and water and allowed to air dry only.  Should the brace result in increased pain,  decreased feeling (numbness/tingling), increased swelling or an overall worsening of your medical condition, please contact your doctor immediately.  If an emergency situation occurs as a result of wearing the brace after normal business hours, please dial 911 and seek immediate medical attention.  Let your doctor know if you have any further questions about the brace issued to you. Refer to the shoulder sling instructions for use if you have any questions regarding the correct fit of your shoulder sling.  [facility] Customer Care for Troubleshooting: [phone]  Video that illustrates how to properly use a shoulder sling: "Instructions for Proper Use of an Orthopaedic Sling" http://bass.com/https://www.youtube.com/watch?v=AHZpn_Xo45w         Interscalene Nerve Block, Care After This sheet gives you information about how to care for yourself after your procedure. Your health care provider may also give you more specific instructions. If you have problems or questions, contact your health care provider. What can I expect after the procedure? After the procedure, it is common to have:  Soreness or tenderness in your neck.  Numbness in your shoulder, upper arm, and some fingers.  Weakness in your shoulder and arm muscles.  The feeling and strength in your shoulder, arm, and fingers should return to normal within hours after your procedure. Follow these instructions at home: For at least 24 hours after the procedure:  Do not: ? Participate in activities in which you could fall or become injured. ? Drive. ? Use heavy machinery. ? Drink alcohol. ? Take sleeping pills or medicines that cause drowsiness. ? Make important decisions or sign legal documents. ? Take care of children on your own.  Rest. Eating and drinking  If you vomit, drink water, juice, or soup when you can drink without vomiting.  Make sure you have little or no nausea before eating solid foods.  Follow the diet that is recommended  by your health care provider. If you have a sling:  Wear it as told by your health care provider. Remove it only as told by your health care provider.  Loosen the sling if your fingers tingle, become numb, or turn cold and blue.  Make sure that your entire arm, including your wrist, is supported. Do not allow your wrist to dangle over the end of the sling.  Do not let your sling get wet if it is not waterproof.  Keep the sling clean. Bathing  Do not take baths, swim, or use a hot tub until your health care provider approves.  If you have a nerve block catheter in place, keep the incision site and tubing dry. Injection site care  Wash your hands with soap and water before you change your bandage (dressing). If soap and water are not available, use hand sanitizer.  Change your dressing as told by your health care provider.  Keep your dressing dry.  Check your nerve block injection site every day for signs of infection. Check for: ? Redness, swelling, or pain. ? Fluid or blood. ? Warmth. Activity  Do not perform complex or risky activities while taking prescription pain medicine and until you have fully recovered.  Return to your normal activities as told by  your health care provider and as you can tolerate them. Ask your health care provider what activities are safe for you.  Rest and take it easy. This will help you heal and recover more quickly and fully.  Be very cautious until you have regained strength and sensation. General instructions  Have a responsible adult stay with you until you are awake and alert.  Do not drive or use heavy machinery while taking prescription pain medicine and until you have fully recovered. Ask your health care provider when it is safe to drive.  Take over-the-counter and prescription medicines only as told by your health care provider.  If you smoke, do not smoke without supervision.  Do not expose your arm or shoulder to very cold or  very hot temperatures until you have full feeling back.  If you have a nerve block catheter in place: ? Try to keep the catheter from getting kinked or pinched. ? Avoid pulling or tugging on the catheter.  Keep all follow-up visits as told by your health care provider. This is important. Contact a health care provider if:  You have chills or fever.  You have redness, swelling, or pain around your injection site.  You have fluid or blood coming from the injection site.  The skin around the injection site is warm to the touch.  There is a bad smell coming from your dressing.  You have hoarseness or a drooping or dry eye that lasts more than a few days.  You have pain that is poorly controlled with the block or with pain medicine.  You have numbness, tingling, or weakness in your shoulder or arm that lasts for more than one week. Get help right away if:  You have severe pain.  You lose or do not regain strength and sensation in your arm even after the nerve block medicine has stopped.  You have trouble breathing.  You have a nerve block catheter still in place and you begin to shiver.  You have a nerve block catheter still in place and you are getting more and more numb or weak. This information is not intended to replace advice given to you by your health care provider. Make sure you discuss any questions you have with your health care provider. Document Released: 06/02/2015 Document Revised: 02/09/2016 Document Reviewed: 02/09/2016 Elsevier Interactive Patient Education  2018 [employer].  AMBULATORY SURGERY  DISCHARGE INSTRUCTIONS   1) The drugs that you were given will stay in your system until tomorrow so for the next 24 hours you should not:  A) Drive an automobile B) Make any legal decisions C) Drink any alcoholic beverage   2) You may resume regular meals tomorrow.  Today it is better to start with liquids and gradually work up to solid foods.  You may eat  anything you prefer, but it is better to start with liquids, then soup and crackers, and gradually work up to solid foods.   3) Please notify your doctor immediately if you have any unusual bleeding, trouble breathing, redness and pain at the surgery site, drainage, fever, or pain not relieved by medication.    4) Additional Instructions:        Please contact your physician with any problems or Same Day Surgery at [phone], Monday through Friday 6 am to 4 pm, or [place] at [facility] number at [phone].

## 2018-01-19 NOTE — H&P (Signed)
Paper H&P to be scanned into permanent record. H&P reviewed. No significant changes noted.  

## 2018-08-20 ENCOUNTER — Other Ambulatory Visit: Payer: Self-pay

## 2018-08-20 ENCOUNTER — Encounter: Payer: Self-pay | Admitting: *Deleted

## 2018-09-01 ENCOUNTER — Ambulatory Visit: Payer: Worker's Compensation | Admitting: Anesthesiology

## 2018-09-01 ENCOUNTER — Ambulatory Visit
Admission: RE | Admit: 2018-09-01 | Discharge: 2018-09-01 | Disposition: A | Discharge status: Home / Self Care | Payer: Worker's Compensation | Attending: Orthopedic Surgery | Admitting: Orthopedic Surgery

## 2018-09-01 ENCOUNTER — Encounter
Admission: RE | Disposition: A | Discharge status: Home / Self Care | Payer: Self-pay | Source: Home / Self Care | Attending: Orthopedic Surgery

## 2018-09-01 DIAGNOSIS — M7542 Impingement syndrome of left shoulder: Secondary | ICD-10-CM | POA: Diagnosis not present

## 2018-09-01 DIAGNOSIS — Z8379 Family history of other diseases of the digestive system: Secondary | ICD-10-CM | POA: Insufficient documentation

## 2018-09-01 DIAGNOSIS — S46012D Strain of muscle(s) and tendon(s) of the rotator cuff of left shoulder, subsequent encounter: Secondary | ICD-10-CM | POA: Insufficient documentation

## 2018-09-01 DIAGNOSIS — X500XXD Overexertion from strenuous movement or load, subsequent encounter: Secondary | ICD-10-CM | POA: Diagnosis not present

## 2018-09-01 DIAGNOSIS — S46012A Strain of muscle(s) and tendon(s) of the rotator cuff of left shoulder, initial encounter: Secondary | ICD-10-CM | POA: Diagnosis present

## 2018-09-01 DIAGNOSIS — Z8249 Family history of ischemic heart disease and other diseases of the circulatory system: Secondary | ICD-10-CM | POA: Insufficient documentation

## 2018-09-01 DIAGNOSIS — M7502 Adhesive capsulitis of left shoulder: Secondary | ICD-10-CM | POA: Diagnosis not present

## 2018-09-01 HISTORY — PX: SHOULDER ARTHROSCOPY WITH DEBRIDEMENT AND BICEP TENDON REPAIR: SHX5690

## 2018-09-01 HISTORY — DX: Cervicalgia: M54.2

## 2018-09-01 SURGERY — SHOULDER ARTHROSCOPY WITH DEBRIDEMENT AND BICEP TENDON REPAIR
Anesthesia: General | Site: Shoulder | Laterality: Left

## 2018-09-01 MED ORDER — OXYCODONE HCL 5 MG PO TABS: 5.0000 mg | ORAL_TABLET | Freq: Once | ORAL | Status: DC | PRN

## 2018-09-01 MED ORDER — DEXAMETHASONE SODIUM PHOSPHATE 4 MG/ML IJ SOLN
INTRAMUSCULAR | Status: DC | PRN
  Administered 2018-09-01: 4 mg via INTRAVENOUS

## 2018-09-01 MED ORDER — LACTATED RINGERS IV SOLN
INTRAVENOUS | Status: DC
  Administered 2018-09-01: 11:00:00 via INTRAVENOUS

## 2018-09-01 MED ORDER — OXYCODONE HCL 5 MG/5ML PO SOLN: 5.0000 mg | Freq: Once | ORAL | Status: DC | PRN

## 2018-09-01 MED ORDER — IBUPROFEN 800 MG PO TABS: 800.0000 mg | ORAL_TABLET | Freq: Three times a day (TID) | ORAL | 0 refills | Status: AC

## 2018-09-01 MED ORDER — ASPIRIN EC 325 MG PO TBEC: 325.0000 mg | DELAYED_RELEASE_TABLET | Freq: Every day | ORAL | 0 refills | Status: AC

## 2018-09-01 MED ORDER — LACTATED RINGERS IV SOLN
INTRAVENOUS | Status: DC | PRN
  Administered 2018-09-01: 14 mL

## 2018-09-01 MED ORDER — PROPOFOL 10 MG/ML IV BOLUS
INTRAVENOUS | Status: DC | PRN
  Administered 2018-09-01: 200 mg via INTRAVENOUS
  Administered 2018-09-01: 30 mg via INTRAVENOUS
  Administered 2018-09-01: 50 mg via INTRAVENOUS

## 2018-09-01 MED ORDER — BUPIVACAINE LIPOSOME 1.3 % IJ SUSP
INTRAMUSCULAR | Status: DC | PRN
  Administered 2018-09-01: 20 mL via PERINEURAL

## 2018-09-01 MED ORDER — BUPIVACAINE HCL (PF) 0.5 % IJ SOLN
INTRAMUSCULAR | Status: DC | PRN
  Administered 2018-09-01: 20 mL

## 2018-09-01 MED ORDER — ONDANSETRON HCL 4 MG/2ML IJ SOLN
4.0000 mg | Freq: Once | INTRAMUSCULAR | Status: AC | PRN
  Administered 2018-09-01: 4 mg via INTRAVENOUS

## 2018-09-01 MED ORDER — GLYCOPYRROLATE 0.2 MG/ML IJ SOLN
INTRAMUSCULAR | Status: DC | PRN
  Administered 2018-09-01: 0.1 mg via INTRAVENOUS

## 2018-09-01 MED ORDER — ACETAMINOPHEN 500 MG PO TABS: 1000.0000 mg | ORAL_TABLET | Freq: Three times a day (TID) | ORAL | 2 refills | Status: AC

## 2018-09-01 MED ORDER — FENTANYL CITRATE (PF) 100 MCG/2ML IJ SOLN: 25.0000 ug | INTRAMUSCULAR | Status: DC | PRN

## 2018-09-01 MED ORDER — OXYCODONE HCL 5 MG PO TABS: 5.0000 mg | ORAL_TABLET | ORAL | 0 refills | Status: AC | PRN

## 2018-09-01 MED ORDER — CEFAZOLIN SODIUM-DEXTROSE 2-4 GM/100ML-% IV SOLN
2.0000 g | Freq: Once | INTRAVENOUS | Status: AC
  Administered 2018-09-01: 2 g via INTRAVENOUS

## 2018-09-01 MED ORDER — LIDOCAINE HCL (CARDIAC) PF 100 MG/5ML IV SOSY
PREFILLED_SYRINGE | INTRAVENOUS | Status: DC | PRN
  Administered 2018-09-01: 40 mg via INTRATRACHEAL

## 2018-09-01 MED ORDER — LABETALOL HCL 5 MG/ML IV SOLN
5.0000 mg | INTRAVENOUS | Status: DC | PRN
  Administered 2018-09-01 (×2): 5 mg via INTRAVENOUS

## 2018-09-01 MED ORDER — FENTANYL CITRATE (PF) 100 MCG/2ML IJ SOLN
INTRAMUSCULAR | Status: DC | PRN
  Administered 2018-09-01: 100 ug via INTRAVENOUS

## 2018-09-01 MED ORDER — EPHEDRINE SULFATE 50 MG/ML IJ SOLN
INTRAMUSCULAR | Status: DC | PRN
  Administered 2018-09-01 (×3): 5 mg via INTRAVENOUS

## 2018-09-01 MED ORDER — MIDAZOLAM HCL 2 MG/2ML IJ SOLN
INTRAMUSCULAR | Status: DC | PRN
  Administered 2018-09-01: 2 mg via INTRAVENOUS

## 2018-09-01 MED ORDER — ONDANSETRON HCL 4 MG/2ML IJ SOLN
INTRAMUSCULAR | Status: DC | PRN
  Administered 2018-09-01: 4 mg via INTRAVENOUS

## 2018-09-01 MED ORDER — ONDANSETRON 4 MG PO TBDP: 4.0000 mg | ORAL_TABLET | Freq: Three times a day (TID) | ORAL | 0 refills | Status: AC | PRN

## 2018-09-01 SURGICAL SUPPLY — 63 items
ADAPTER IRRIG TUBE 2 SPIKE SOL (ADAPTER) ×6 IMPLANT
ADH SKN CLS APL DERMABOND .7 (GAUZE/BANDAGES/DRESSINGS)
ADPR TBG 2 SPK PMP STRL ASCP (ADAPTER) ×2
BUR BR 5.5 12 FLUTE (BURR) ×2 IMPLANT
BUR RADIUS 4.0X18.5 (BURR) IMPLANT
CANNULA 5.75X7 CRYSTAL CLEAR (CANNULA) IMPLANT
CANNULA PARTIAL THREAD 2X7 (CANNULA) IMPLANT
CANNULA TWIST IN 8.25X9CM (CANNULA) IMPLANT
CHLORAPREP W/TINT 26ML (MISCELLANEOUS) ×3 IMPLANT
CLOSURE WOUND 1/2 X4 (GAUZE/BANDAGES/DRESSINGS)
COVER LIGHT HANDLE UNIVERSAL (MISCELLANEOUS) ×3 IMPLANT
DERMABOND ADVANCED (GAUZE/BANDAGES/DRESSINGS)
DERMABOND ADVANCED .7 DNX12 (GAUZE/BANDAGES/DRESSINGS) IMPLANT
DRAPE IMP U-DRAPE 54X76 (DRAPES) ×6 IMPLANT
DRAPE INCISE IOBAN 66X45 STRL (DRAPES) ×3 IMPLANT
DRAPE SHEET LG 3/4 BI-LAMINATE (DRAPES) ×3 IMPLANT
DRAPE STERI 35X30 U-POUCH (DRAPES) ×3 IMPLANT
DRAPE U-SHAPE 48X52 POLY STRL (PACKS) ×6 IMPLANT
DRSG TEGADERM 4X4.75 (GAUZE/BANDAGES/DRESSINGS) ×6 IMPLANT
ELECT REM PT RETURN 9FT ADLT (ELECTROSURGICAL) ×3
ELECTRODE REM PT RTRN 9FT ADLT (ELECTROSURGICAL) ×1 IMPLANT
GAUZE PETRO XEROFOAM 1X8 (MISCELLANEOUS) ×3 IMPLANT
GAUZE SPONGE 4X4 12PLY STRL (GAUZE/BANDAGES/DRESSINGS) ×3 IMPLANT
GLOVE BIOGEL PI IND STRL 8 (GLOVE) ×1 IMPLANT
GLOVE BIOGEL PI INDICATOR 8 (GLOVE) ×2
GOWN STRL REUS W/ TWL LRG LVL3 (GOWN DISPOSABLE) ×1 IMPLANT
GOWN STRL REUS W/TWL LRG LVL3 (GOWN DISPOSABLE) ×3
GOWN STRL REUS W/TWL LRG LVL4 (GOWN DISPOSABLE) ×3 IMPLANT
IV LACTATED RINGER IRRG 3000ML (IV SOLUTION) ×42
IV LR IRRIG 3000ML ARTHROMATIC (IV SOLUTION) ×8 IMPLANT
KIT STABILIZATION SHOULDER (MISCELLANEOUS) ×3 IMPLANT
KIT SUTURETAK 3.0 INSERT PERC (KITS) ×3 IMPLANT
KIT TURNOVER KIT A (KITS) ×3 IMPLANT
MANIFOLD 4PT FOR NEPTUNE1 (MISCELLANEOUS) ×3 IMPLANT
MASK FACE SPIDER DISP (MASK) ×3 IMPLANT
MAT ABSORB  FLUID 56X50 GRAY (MISCELLANEOUS) ×6
MAT ABSORB FLUID 56X50 GRAY (MISCELLANEOUS) ×2 IMPLANT
NDL SAFETY ECLIPSE 18X1.5 (NEEDLE) ×1 IMPLANT
NEEDLE HYPO 18GX1.5 SHARP (NEEDLE) ×3
NEEDLE HYPO 22GX1.5 SAFETY (NEEDLE) ×3 IMPLANT
PACK ARTHROSCOPY SHOULDER (MISCELLANEOUS) ×3 IMPLANT
PENCIL SMOKE EVACUATOR (MISCELLANEOUS) ×1 IMPLANT
SET TUBE SUCT SHAVER OUTFL 24K (TUBING) ×3 IMPLANT
SET TUBE TIP INTRA-ARTICULAR (MISCELLANEOUS) ×3 IMPLANT
SPONGE GAUZE 2X2 8PLY STER LF (GAUZE/BANDAGES/DRESSINGS) ×3
SPONGE GAUZE 2X2 8PLY STRL LF (GAUZE/BANDAGES/DRESSINGS) ×3 IMPLANT
STAPLER SKIN PROX 35W (STAPLE) IMPLANT
STRIP CLOSURE SKIN 1/2X4 (GAUZE/BANDAGES/DRESSINGS) IMPLANT
SUT ETHILON 3-0 (SUTURE) ×2 IMPLANT
SUT LASSO 90 DEG SD STR (SUTURE) IMPLANT
SUT MNCRL 4-0 (SUTURE)
SUT MNCRL 4-0 27XMFL (SUTURE)
SUT TICRON 2-0 30IN 311381 (SUTURE) IMPLANT
SUT VIC AB 0 CT1 36 (SUTURE) IMPLANT
SUT VIC AB 2-0 CT2 27 (SUTURE) IMPLANT
SUT VICRYL 3-0 27IN (SUTURE) IMPLANT
SUTURE MNCRL 4-0 27XMF (SUTURE) IMPLANT
SYR 10ML LL (SYRINGE) ×3 IMPLANT
TAPE MICROFOAM 4IN (TAPE) ×1 IMPLANT
TUBING ARTHRO INFLOW-ONLY STRL (TUBING) ×3 IMPLANT
TUBING CONNECTING 10 (TUBING) ×2 IMPLANT
TUBING CONNECTING 10' (TUBING) ×1
WAND HAND CNTRL MULTIVAC 90 (MISCELLANEOUS) ×3 IMPLANT

## 2018-09-01 NOTE — Anesthesia Postprocedure Evaluation (Signed)
Anesthesia Post Note  Patient: Vernon Burke  Procedure(s) Performed: SHOULDER ARTHROSCOPY WITH  (Left Shoulder)  Patient location during evaluation: PACU Anesthesia Type: General Level of consciousness: awake and alert Pain management: pain level controlled Vital Signs Assessment: post-procedure vital signs reviewed and stable Respiratory status: spontaneous breathing, nonlabored ventilation, respiratory function stable and patient connected to nasal cannula oxygen Cardiovascular status: blood pressure returned to baseline and stable Postop Assessment: no apparent nausea or vomiting Anesthetic complications: no    Scarlette Slice

## 2018-09-01 NOTE — Anesthesia Procedure Notes (Signed)
Anesthesia Regional Block: Interscalene brachial plexus block   Pre-Anesthetic Checklist: ,, timeout performed, Correct Patient, Correct Site, Correct Laterality, Correct Procedure, Correct Position, site marked, Risks and benefits discussed,  Surgical consent,  Pre-op evaluation,  At surgeon's request and post-op pain management  Laterality: Left  Prep: chloraprep       Needles:  Injection technique: Single-shot  Needle Type: Stimiplex     Needle Length: 10cm  Needle Gauge: 21     Additional Needles:   Procedures:,,,, ultrasound used (permanent image in chart),,,,  Narrative:  Injection made incrementally with aspirations every 5 mL.  Performed by: Personally   Additional Notes: Functioning IV was confirmed and monitors applied. Ultrasound guidance: relevant anatomy identified, needle position confirmed, local anesthetic spread visualized around nerve(s)., vascular puncture avoided.  Image printed for medical record.  Negative aspiration and no paresthesias; incremental administration of local anesthetic. The patient tolerated the procedure well. Vitals signes recorded in RN notes.      

## 2018-09-01 NOTE — H&P (Signed)
Paper H&P to be scanned into permanent record. H&P reviewed. No significant changes noted.  

## 2018-09-01 NOTE — Discharge Instructions (Signed)
Post-Op Instructions - Arthroscopic Shoulder Surgery  1. Bracing: You will wear a shoulder immobilizer or sling for no longer than 2 weeks. You can self wean from sling when it is comfortable to do so.   2. Driving: No driving for at least 2 weeks post-op.   3. Activity: Progress to motion as tolerated, moving from passive to active-assisted to active motion. Goal for motion by 4 weeks is 140 degrees forward flexion, 40 ER, IR behind back. Avoid abduction and ER/IR until after 4 weeks.  Return to daily activities normally takes 2-3 months on average.  4. Physical Therapy: Begins 3-4 days after surgery  5. Medications:  - You will be provided a prescription for narcotic pain medicine. After surgery, take 1-2 narcotic tablets every 4 hours if needed for severe pain.  - A prescription for anti-nausea medication will be provided in case the narcotic medicine causes nausea - take 1 tablet every 6 hours only if nauseated.   - Take ASA /day x 2 weeks to help prevent DVTs/PEs (blood clots).  - Take ibuprofen  three times/day with food. Stop if there is any GI irritation. - Take tylenol  3x/day until no pain   If you are taking prescription medication for anxiety, depression, insomnia, muscle spasm, chronic pain, or for attention deficit disorder, you are advised that you are at a higher risk of adverse effects with use of narcotics post-op, including narcotic addiction/dependence, depressed breathing, death. If you use non-prescribed substances: alcohol, marijuana, cocaine, heroin, methamphetamines, etc., you are at a higher risk of adverse effects with use of narcotics post-op, including narcotic addiction/dependence, depressed breathing, death. You are advised that taking > 50 morphine milligram equivalents (MME) of narcotic pain medication per day results in twice the risk of overdose or death. For your prescription provided: hydrocodone 5 mg - taking more than 8 tablets per day would  result in > 50 morphine milligram equivalents (MME) of narcotic pain medication. Be advised that we will prescribe narcotics short-term, for acute post-operative pain only - 3 weeks for major operations such as shoulder repair/reconstruction surgeries.    6. Post-Op Appointment:  Your first post-op appointment will be 10-14 days post-op.  7. Work or School: For most, but not all procedures, we advise staying out of work or school for at least 1 to 2 weeks in order to recover from the stress of surgery and to allow time for healing.   If you need a work or school note this can be provided.   8. Smoking: If you are a smoker, you need to refrain from smoking in the postoperative period. The nicotine in cigarettes will inhibit healing of your shoulder repair and decrease the chance of successful repair. Similarly, nicotine containing products (gum, patches) should be avoided.   Post-operative Brace: Apply and remove the brace you received as you were instructed to at the time of fitting and as described in detail as the braces instructions for use indicate.  Wear the brace for the period of time prescribed by your physician.  The brace can be cleaned with soap and water and allowed to air dry only.  Should the brace result in increased pain, decreased feeling (numbness/tingling), increased swelling or an overall worsening of your medical condition, please contact your doctor immediately.  If an emergency situation occurs as a result of wearing the brace after normal business hours, please dial 911 and seek immediate medical attention.  Let your doctor know if you have any further questions  about the brace issued to you. Refer to the shoulder sling instructions for use if you have any questions regarding the correct fit of your shoulder sling.  [facility] Customer Care for Troubleshooting: [phone]  Video that illustrates how to properly use a shoulder sling: "Instructions for Proper Use of an  Orthopaedic Sling" http://bass.com/    Information for Discharge Teaching: EXPAREL (bupivacaine liposome injectable suspension)   Your surgeon or anesthesiologist gave you EXPAREL(bupivacaine) to help control your pain after surgery.   EXPAREL is a local anesthetic that provides pain relief by numbing the tissue around the surgical site.  EXPAREL is designed to release pain medication over time and can control pain for up to 72 hours.  Depending on how you respond to EXPAREL, you may require less pain medication during your recovery.  Possible side effects:  Temporary loss of sensation or ability to move in the area where bupivacaine was injected.  Nausea, vomiting, constipation  Rarely, numbness and tingling in your mouth or lips, lightheadedness, or anxiety may occur.  Call your doctor right away if you think you may be experiencing any of these sensations, or if you have other questions regarding possible side effects.  Follow all other discharge instructions given to you by your surgeon or nurse. Eat a healthy diet and drink plenty of water or other fluids.  If you return to the hospital for any reason within 96 hours following the administration of EXPAREL, it is important for health care providers to know that you have received this anesthetic. A teal colored band has been placed on your arm with the date, time and amount of EXPAREL you have received in order to alert and inform your health care providers. Please leave this armband in place for the full 96 hours following administration, and then you may remove the band.  General Anesthesia, Adult, Care After This sheet gives you information about how to care for yourself after your procedure. Your health care provider may also give you more specific instructions. If you have problems or questions, contact your health care provider. What can I expect after the procedure? After the procedure, the  following side effects are common:  Pain or discomfort at the IV site.  Nausea.  Vomiting.  Sore throat.  Trouble concentrating.  Feeling cold or chills.  Weak or tired.  Sleepiness and fatigue.  Soreness and body aches. These side effects can affect parts of the body that were not involved in surgery. Follow these instructions at home:  For at least 24 hours after the procedure:  Have a responsible adult stay with you. It is important to have someone help care for you until you are awake and alert.  Rest as needed.  Do not: ? Participate in activities in which you could fall or become injured. ? Drive. ? Use heavy machinery. ? Drink alcohol. ? Take sleeping pills or medicines that cause drowsiness. ? Make important decisions or sign legal documents. ? Take care of children on your own. Eating and drinking  Follow any instructions from your health care provider about eating or drinking restrictions.  When you feel hungry, start by eating small amounts of foods that are soft and easy to digest (bland), such as toast. Gradually return to your regular diet.  Drink enough fluid to keep your urine pale yellow.  If you vomit, rehydrate by drinking water, juice, or clear broth. General instructions  If you have sleep apnea, surgery and certain medicines can increase your risk for  breathing problems. Follow instructions from your health care provider about wearing your sleep device: ? Anytime you are sleeping, including during daytime naps. ? While taking prescription pain medicines, sleeping medicines, or medicines that make you drowsy.  Return to your normal activities as told by your health care provider. Ask your health care provider what activities are safe for you.  Take over-the-counter and prescription medicines only as told by your health care provider.  If you smoke, do not smoke without supervision.  Keep all follow-up visits as told by your health care  provider. This is important. Contact a health care provider if:  You have nausea or vomiting that does not get better with medicine.  You cannot eat or drink without vomiting.  You have pain that does not get better with medicine.  You are unable to pass urine.  You develop a skin rash.  You have a fever.  You have redness around your IV site that gets worse. Get help right away if:  You have difficulty breathing.  You have chest pain.  You have blood in your urine or stool, or you vomit blood. Summary  After the procedure, it is common to have a sore throat or nausea. It is also common to feel tired.  Have a responsible adult stay with you for the first 24 hours after general anesthesia. It is important to have someone help care for you until you are awake and alert.  When you feel hungry, start by eating small amounts of foods that are soft and easy to digest (bland), such as toast. Gradually return to your regular diet.  Drink enough fluid to keep your urine pale yellow.  Return to your normal activities as told by your health care provider. Ask your health care provider what activities are safe for you. This information is not intended to replace advice given to you by your health care provider. Make sure you discuss any questions you have with your health care provider. Document Released: 09/16/2000 Document Revised: 01/24/2017 Document Reviewed: 01/24/2017 Elsevier Interactive Patient Education  2019 [employer].

## 2018-09-01 NOTE — Transfer of Care (Signed)
Immediate Anesthesia Transfer of Care Note  Patient: Vernon Burke  Procedure(s) Performed: SHOULDER ARTHROSCOPY WITH DEBRIDEMENT AND POSSIBLE ROTATOR CUFF REPAIR MAC/REG W/INTERSCALENE BLOCK W/ EXPAREL SMITH & NEPHEW HEALICOIL ANCHOR REGENETEN PATCH ARTHROCARE [address] (Left )  Patient Location: PACU  Anesthesia Type: General  Level of Consciousness: awake, alert  and patient cooperative  Airway and Oxygen Therapy: Patient Spontanous Breathing and Patient connected to supplemental oxygen  Post-op Assessment: Post-op Vital signs reviewed, Patient's Cardiovascular Status Stable, Respiratory Function Stable, Patent Airway and No signs of Nausea or vomiting  Post-op Vital Signs: Reviewed and stable  Complications: No apparent anesthesia complications

## 2018-09-01 NOTE — Anesthesia Preprocedure Evaluation (Signed)
Anesthesia Evaluation  Patient identified by MRN, date of birth, ID band Patient awake    Reviewed: Allergy & Precautions, H&P , NPO status , Patient's Chart, lab work & pertinent test results, reviewed documented beta blocker date and time   Airway Mallampati: II  TM Distance: >3 FB Neck ROM: full    Dental no notable dental hx.    Pulmonary neg pulmonary ROS,    Pulmonary exam normal breath sounds clear to auscultation       Cardiovascular Exercise Tolerance: Good negative cardio ROS   Rhythm:regular Rate:Normal     Neuro/Psych negative neurological ROS  negative psych ROS   GI/Hepatic negative GI ROS, Neg liver ROS,   Endo/Other  negative endocrine ROS  Renal/GU negative Renal ROS  negative genitourinary   Musculoskeletal   Abdominal   Peds  Hematology negative hematology ROS (+)   Anesthesia Other Findings   Reproductive/Obstetrics negative OB ROS                             Anesthesia Physical Anesthesia Plan  ASA: I  Anesthesia Plan: General   Post-op Pain Management:  Regional for Post-op pain   Induction:   PONV Risk Score and Plan:   Airway Management Planned:   Additional Equipment:   Intra-op Plan:   Post-operative Plan:   Informed Consent: I have reviewed the patients History and Physical, chart, labs and discussed the procedure including the risks, benefits and alternatives for the proposed anesthesia with the patient or authorized representative who has indicated his/her understanding and acceptance.     Dental Advisory Given  Plan Discussed with: CRNA  Anesthesia Plan Comments:         Anesthesia Quick Evaluation

## 2018-09-01 NOTE — Op Note (Signed)
SURGERY DATE: 09/01/2018  PRE-OP DIAGNOSIS:  1.  Left partial-thickness rotator cuff tear 2.  Left shoulder rotator interval adhesions   POST-OP DIAGNOSIS: 1.  Left partial-thickness rotator cuff tear 2.  Left shoulder rotator interval adhesions 3.  Left subcoracoid impingement   PROCEDURES:  1. Left shoulder arthroscopic lysis of adhesions 2. Left shoulder arthroscopic extensive debridement of shoulder (glenohumeral and subacromial spaces) 3. Left shoulder arthroscopic subcoracoid decompression   SURGEON: Rosealee Albee, MD  ANESTHESIA: Gen with Exparil interscalene block  ESTIMATED BLOOD LOSS: 5cc  DRAINS:  none  TOTAL IV FLUIDS: per anesthesia   SPECIMENS: none  OPERATIVE FINDINGS:  Examination under anesthesia: A careful examination under anesthesia was performed.  Passive range of motion was: FF: 140; ER at side: 50; ER in abduction: 90; IR in abduction: 50.  Anterior load shift: 1+.  Posterior load shift: 1+.  Sulcus in neutral: NT.  Sulcus in ER: NT.    Intra-operative findings: A thorough arthroscopic examination of the shoulder was performed.  The findings are: 1. Biceps tendon: Not visualized in the joint 2. Superior labrum: injected with surrounding synovitis 3. Posterior labrum and capsule: normal 4. Inferior capsule and inferior recess: normal 5. Glenoid cartilage surface: normal 6. Supraspinatus attachment: Partial-thickness articular sided tear 7. Posterior rotator cuff attachment: Normal 8. Humeral head articular cartilage: normal 9. Rotator interval: significant synovitis and adhesions, interval between posterior aspect of the coracoid and subscapularis measured approximately 3 mm. 10: Subscapularis tendon: Normal, except with significant adhesions surrounding tendon insertion 11. Anterior labrum: Significant synovitis 12. IGHL: normal  OPERATIVE REPORT:   Indications for procedure: Vernon Burke is a 48 y.o. male with who underwent left  shoulder arthroscopy with regenerative and patch application, biceps tenodesis, distal clavicle excision, and subacromial decompression on 01/19/2018.  He noted improvement initially until Wednesday he felt that he was overworked with physical therapy that resulted in significant pain.  This pain is located along the anterior aspect of his shoulder about his coracoid.  He had an associated clicking/catching sensation in this region with going from internal to external rotation.  He had a trigger point injection to this area which resolved his symptoms for approximately 3 days before returning.  Additionally, he has had multiple glenohumeral joint and subacromial space injections which provide him excellent, but only short-term relief.  Repeat MRI showed possible area of rotator cuff tear due to subcoracoid recess fluid.  No gross rotator cuff tear was seen on preoperative MRI.  After discussion of risks, benefits, and alternatives to surgery, the patient elected to proceed in the hopes of achieving improved pain relief.    Procedure in detail:  I identified [name] in the pre-operative holding area.  I marked the operative shoulder with my initials. I reviewed the risks and benefits of the proposed surgical intervention, and the patient wished to proceed.  Anesthesia was then performed with an interscalene block with Exparel.  Patient was transferred to the operating room, general anesthesia was administered, and the patient was placed in the beach chair position.    SCDs were placed on the lower extremities. Appropriate IV antibiotics were administered prior to incision. The operative upper extremity was then prepped and draped in standard fashion. A time out was performed confirming the correct extremity, correct patient, and correct procedure.   I used the previously placed posterior and anterior portals, and created them with an 11 blade. The glenohumeral joint was easily entered from the  posterior portal with a  blunt trochar and the arthroscope introduced.  Anterior portal was established next.  The findings of diagnostic arthroscopy are described above. I debrided degenerative tissue including the synovitic tissue about the anterior labrum and superior labrum.  I also debrided the partial-thickness rotator cuff tear at the anterior aspect of the supraspinatus.  Adhesions about the subscapularis were lysed with a combination of an oscillating shaver as well as an Oncologist.  The adhesions about the rotator interval were also lysed in a similar fashion.  The coracoid process was identified and the space between the posterior aspect of the coracoid and the subscapularis was noted to be approximately 3 mm.  A 5.5 mm barrel burr was used to perform a coracoplasty of the posterior lateral coracoid so that there was approximately 8 mm of space between the posterior aspect of the coracoid and the subscapularis tendon.  The arm was taken through a range of motion and there was no subcoracoid impingement.  ArthroCare wand was then used to coagulate the inflamed synovium to obtain hemostasis and reduce the risk of post-operative swelling.   Next, the arthroscope was then introduced into the subacromial space. A direct lateral portal was created through the previously placed scar with an 11-blade after spinal needle localization. An extensive subacromial bursectomy was performed using a combination of the shaver and Arthrocare wand. The previously placed Regeneten patch was well-integrated into the rotator cuff. The superior rotator cuff was extensively probed and there was no full-thickness or high-grade partial thickness tear. There were some areas of synovitis along the anterior edge of the supraspinatus and these were debrided with a ArthroCare wand.    Fluid was evacuated from the shoulder, and the portals were closed with 3-0 Nylon. Xeroform was applied to the portals. A sterile dressing was  applied, followed by a Polar Care sleeve and a SlingShot shoulder immobilizer/sling. The patient was awakened from anesthesia without difficulty and was transferred to the PACU in stable condition.     COMPLICATIONS: none  DISPOSITION: plan for discharge home after recovery in PACU   POSTOPERATIVE PLAN: Remain in sling as needed for comfort, but no longer than 2 weeks.  ASA 325 mg/day x2 weeks for DVT prophylaxis.  PT to begin 3-4 days after surgery.  Use general shoulder arthroscopy/subacromial decompression rehab protocol.

## 2018-09-01 NOTE — Anesthesia Procedure Notes (Signed)
Procedure Name: LMA Insertion Date/Time: 09/01/2018 12:56 PM Performed by: Jimmy Picket, CRNA Pre-anesthesia Checklist: Patient identified, Emergency Drugs available, Suction available, Timeout performed and Patient being monitored Patient Re-evaluated:Patient Re-evaluated prior to induction Oxygen Delivery Method: Circle system utilized Preoxygenation: Pre-oxygenation with 100% oxygen Induction Type: IV induction LMA: LMA inserted LMA Size: 4.0 Number of attempts: 1 Placement Confirmation: positive ETCO2 and breath sounds checked- equal and bilateral Tube secured with: Tape

## 2018-09-01 NOTE — Anesthesia Postprocedure Evaluation (Signed)
Anesthesia Post Note  Patient: Vernon Burke  Procedure(s) Performed: SHOULDER ARTHROSCOPY WITH DEBRIDEMENT AND POSSIBLE ROTATOR CUFF REPAIR MAC/REG W/INTERSCALENE BLOCK W/ EXPAREL SMITH & NEPHEW HEALICOIL ANCHOR Cotton 90 (Left )  Patient location during evaluation: PACU Anesthesia Type: General Level of consciousness: awake and alert Pain management: pain level controlled Vital Signs Assessment: post-procedure vital signs reviewed and stable Respiratory status: spontaneous breathing, nonlabored ventilation, respiratory function stable and patient connected to nasal cannula oxygen Cardiovascular status: blood pressure returned to baseline and stable Postop Assessment: no apparent nausea or vomiting Anesthetic complications: no    Alisa Graff

## 2018-09-02 ENCOUNTER — Encounter: Payer: Self-pay | Admitting: Orthopedic Surgery

## 2018-11-03 ENCOUNTER — Encounter: Payer: Self-pay | Admitting: Orthopedic Surgery

## 2021-01-29 ENCOUNTER — Emergency Department: Payer: BC Managed Care – PPO

## 2021-01-29 ENCOUNTER — Emergency Department
Admission: EM | Admit: 2021-01-29 | Discharge: 2021-01-29 | Disposition: A | Discharge status: Home / Self Care | Payer: BC Managed Care – PPO | Attending: Emergency Medicine | Admitting: Emergency Medicine

## 2021-01-29 ENCOUNTER — Encounter: Payer: Self-pay | Admitting: Intensive Care

## 2021-01-29 ENCOUNTER — Other Ambulatory Visit: Payer: Self-pay

## 2021-01-29 DIAGNOSIS — E119 Type 2 diabetes mellitus without complications: Secondary | ICD-10-CM | POA: Diagnosis not present

## 2021-01-29 DIAGNOSIS — I10 Essential (primary) hypertension: Secondary | ICD-10-CM | POA: Insufficient documentation

## 2021-01-29 DIAGNOSIS — R42 Dizziness and giddiness: Secondary | ICD-10-CM

## 2021-01-29 HISTORY — DX: Depression, unspecified: F32.A

## 2021-01-29 HISTORY — DX: Anxiety disorder, unspecified: F41.9

## 2021-01-29 HISTORY — DX: Type 2 diabetes mellitus without complications: E11.9

## 2021-01-29 HISTORY — DX: Pure hypercholesterolemia, unspecified: E78.00

## 2021-01-29 HISTORY — DX: Essential (primary) hypertension: I10

## 2021-01-29 LAB — COMPREHENSIVE METABOLIC PANEL
ALT: 34 U/L (ref 0–44)
AST: 27 U/L (ref 15–41)
Albumin: 4.3 g/dL (ref 3.5–5.0)
Alkaline Phosphatase: 88 U/L (ref 38–126)
Anion gap: 10 (ref 5–15)
BUN: 14 mg/dL (ref 6–20)
CO2: 25 mmol/L (ref 22–32)
Calcium: 9.2 mg/dL (ref 8.9–10.3)
Chloride: 99 mmol/L (ref 98–111)
Creatinine, Ser: 0.83 mg/dL (ref 0.61–1.24)
GFR, Estimated: >60 mL/min (ref >60)
Glucose, Bld: 315 mg/dL — ABNORMAL HIGH (ref 70–99)
Potassium: 4 mmol/L (ref 3.5–5.1)
Sodium: 134 mmol/L — ABNORMAL LOW (ref 135–145)
Total Bilirubin: 0.9 mg/dL (ref 0.3–1.2)
Total Protein: 7.7 g/dL (ref 6.5–8.1)

## 2021-01-29 LAB — CBC WITH DIFFERENTIAL/PLATELET
Abs Immature Granulocytes: 0.05 10*3/uL (ref 0.00–0.07)
Basophils Absolute: 0.1 10*3/uL (ref 0.0–0.1)
Basophils Relative: 1 %
Eosinophils Absolute: 0.3 10*3/uL (ref 0.0–0.5)
Eosinophils Relative: 4 %
HCT: 49 % (ref 39.0–52.0)
Hemoglobin: 18.1 g/dL — ABNORMAL HIGH (ref 13.0–17.0)
Immature Granulocytes: 1 %
Lymphocytes Relative: 24 %
Lymphs Abs: 1.9 10*3/uL (ref 0.7–4.0)
MCH: 35.7 pg — ABNORMAL HIGH (ref 26.0–34.0)
MCHC: 36.9 g/dL — ABNORMAL HIGH (ref 30.0–36.0)
MCV: 96.6 fL (ref 80.0–100.0)
Monocytes Absolute: 0.7 10*3/uL (ref 0.1–1.0)
Monocytes Relative: 9 %
Neutro Abs: 4.7 10*3/uL (ref 1.7–7.7)
Neutrophils Relative %: 61 %
Platelets: 168 10*3/uL (ref 150–400)
RBC: 5.07 MIL/uL (ref 4.22–5.81)
RDW: 12.2 % (ref 11.5–15.5)
WBC: 7.7 10*3/uL (ref 4.0–10.5)
nRBC: 0 % (ref 0.0–0.2)

## 2021-01-29 MED ORDER — MECLIZINE HCL 25 MG PO TABS
25.0000 mg | ORAL_TABLET | Freq: Once | ORAL | Status: AC
  Administered 2021-01-29: 25 mg via ORAL
  Filled 2021-01-29: qty 1

## 2021-01-29 MED ORDER — MECLIZINE HCL 25 MG PO TABS: 25.0000 mg | ORAL_TABLET | Freq: Three times a day (TID) | ORAL | 0 refills | Status: AC | PRN

## 2021-01-29 MED ORDER — LACTATED RINGERS IV BOLUS
1000.0000 mL | Freq: Once | INTRAVENOUS | Status: AC
  Administered 2021-01-29: 1000 mL via INTRAVENOUS

## 2021-01-29 NOTE — ED Provider Notes (Signed)
[facility] Emergency Department Provider Note   ____________________________________________   Event Date/Time   First MD Initiated Contact with Patient 01/29/21 1446     (approximate)  I have reviewed the triage vital signs and the nursing notes.   HISTORY  Chief Complaint Dizziness    HPI Vernon Burke is a 50 y.o. male with past medical history of hypertension, hyperlipidemia, and diabetes who presents to the ED complaining of dizziness.  Patient reports that he has been dealing with intermittent dizziness and lightheadedness for the past couple of days.  He states when the episodes come on he does not feel like he is going to pass out, but instead feels like his "equilibrium is off" and that he is unsteady on his feet.  He denies any associated vision changes, speech changes, numbness, or weakness.  Symptoms seem to be worse with certain changes in position.  He denies any history of similar symptoms, does state that his daughter gave him a Dramamine which seem to help.  He has not had any fevers, cough, chest pain, or shortness of breath, denies recent vomiting or diarrhea.        Past Medical History:  Diagnosis Date   Anxiety    Depression    Diabetes mellitus without complication (HCC)    High cholesterol    Hypertension    Neck pain    since injury 08/20/17    There are no problems to display for this patient.   Past Surgical History:  Procedure Laterality Date   BICEPT TENODESIS Left 01/19/2018   Procedure: BICEPS TENODESIS;  Surgeon: Signa Kell, MD;  Location: [facility] ORS;  Service: Orthopedics;  Laterality: Left;   HAND SURGERY Left    fx /pins   HAND SURGERY Left    pins removed   KNEE ARTHROSCOPY Right    SHOULDER ARTHROSCOPY Right    tendons /torn biceps/screws   SHOULDER ARTHROSCOPY WITH DEBRIDEMENT AND BICEP TENDON REPAIR Left 09/01/2018   Procedure: SHOULDER ARTHROSCOPY WITH CORACOPLASTY, EXTENSIVE GLENOHUMERAL  DEBRIDEMENT AND LYSIS OF ADHEISIONS ;  Surgeon: Signa Kell, MD;  Location: [facility];  Service: Orthopedics;  Laterality: Left;    Prior to Admission medications   Medication Sig Start Date End Date Taking? Authorizing Provider  meclizine (ANTIVERT) 25 MG tablet Take 1 tablet (25 mg total) by mouth 3 (three) times daily as needed for dizziness. 01/29/21  Yes Chesley Noon, MD  ondansetron (ZOFRAN ODT) 4 MG disintegrating tablet Take 1 tablet (4 mg total) by mouth every 8 (eight) hours as needed for nausea or vomiting. 09/01/18   Signa Kell, MD    Allergies Patient has no known allergies.  History reviewed. No pertinent family history.  Social History Social History   Tobacco Use   Smoking status: Never   Smokeless tobacco: Never  Vaping Use   Vaping Use: Never used  Substance Use Topics   Alcohol use: Yes    Alcohol/week: 18.0 standard drinks    Types: 18 Cans of beer per week    Comment:     Drug use: Not Currently    Review of Systems  Constitutional: No fever/chills Eyes: No visual changes. ENT: No sore throat. Cardiovascular: Denies chest pain. Respiratory: Denies shortness of breath. Gastrointestinal: No abdominal pain.  No nausea, no vomiting.  No diarrhea.  No constipation. Genitourinary: Negative for dysuria. Musculoskeletal: Negative for back pain. Skin: Negative for rash. Neurological: Negative for headaches, focal weakness or numbness.  Positive for dizziness.  ____________________________________________   PHYSICAL EXAM:  VITAL SIGNS: ED Triage Vitals  Enc Vitals Group     BP 01/29/21 1327 (!) 157/107     Pulse Rate 01/29/21 1327 89     Resp 01/29/21 1327 16     Temp 01/29/21 1327 98.2 F (36.8 C)     Temp Source 01/29/21 1327 Oral     SpO2 01/29/21 1327 100 %     Weight 01/29/21 1328 200 lb (90.7 kg)     Height 01/29/21 1328 6' (1.829 m)     Head Circumference --      Peak Flow --      Pain Score 01/29/21 1328 0     Pain Loc --       Pain Edu? --      Excl. in GC? --     Constitutional: Alert and oriented. Eyes: Conjunctivae are normal.  Pupils equal, round, and reactive to light bilaterally. Head: Atraumatic. Nose: No congestion/rhinnorhea. Mouth/Throat: Mucous membranes are moist. Neck: Normal ROM Cardiovascular: Normal rate, regular rhythm. Grossly normal heart sounds.  2+ radial pulses bilaterally. Respiratory: Normal respiratory effort.  No retractions. Lungs CTAB. Gastrointestinal: Soft and nontender. No distention. Genitourinary: deferred Musculoskeletal: No lower extremity tenderness nor edema. Neurologic:  Normal speech and language. No gross focal neurologic deficits are appreciated. Skin:  Skin is warm, dry and intact. No rash noted. Psychiatric: Mood and affect are normal. Speech and behavior are normal.  ____________________________________________   LABS (all labs ordered are listed, but only abnormal results are displayed)  Labs Reviewed  COMPREHENSIVE METABOLIC PANEL - Abnormal; Notable for the following components:      Result Value   Sodium 134 (*)    Glucose, Bld 315 (*)    All other components within normal limits  CBC WITH DIFFERENTIAL/PLATELET - Abnormal; Notable for the following components:   Hemoglobin 18.1 (*)    MCH 35.7 (*)    MCHC 36.9 (*)    All other components within normal limits   ____________________________________________  EKG  ED ECG REPORT I, Chesley Noon, the attending physician, personally viewed and interpreted this ECG.   Date: 01/29/2021  EKG Time: 13:37  Rate: 90  Rhythm: normal sinus rhythm  Axis: Normal  Intervals:none  ST&T Change: None   PROCEDURES  Procedure(s) performed (including Critical Care):  Procedures   ____________________________________________   INITIAL IMPRESSION / ASSESSMENT AND PLAN / ED COURSE      50 year old male with past medical history of hypertension, hyperlipidemia, diabetes who presents to the ED  complaining of dizziness that seems to worsen with positional changes intermittently over the past couple of days.  Patient has no focal neurologic deficits on exam and symptoms sound most consistent with a peripheral vertigo.  EKG shows no evidence of arrhythmia or ischemia, labs are unremarkable other than some mild hyperglycemia.  We will hydrate with IV fluids, treat symptomatically with meclizine, and check CT head.  CT head is negative for acute process, patient is feeling much better following meclizine and IV fluids.  He was able to ambulate with a steady gait, denies significant dizziness at this time.  I suspect his symptoms are due to a peripheral vertigo and he is appropriate for discharge home with PCP follow-up.  He was counseled to return to the ED for new worsening symptoms, patient agrees with plan.      ____________________________________________   FINAL CLINICAL IMPRESSION(S) / ED DIAGNOSES  Final diagnoses:  Vertigo  Dizziness     ED  Discharge Orders          Ordered    meclizine (ANTIVERT) 25 MG tablet  3 times daily PRN        01/29/21 1654             Note:  This document was prepared using Dragon voice recognition software and may include unintentional dictation errors.    Chesley Noon, MD 01/29/21 1655

## 2021-01-29 NOTE — ED Notes (Signed)
Patient transported to CT 

## 2021-01-29 NOTE — ED Triage Notes (Signed)
Patient c/o dizziness since Wednesday. No other complaints.

## 2021-02-17 ENCOUNTER — Encounter: Payer: Self-pay | Admitting: *Deleted

## 2021-02-17 ENCOUNTER — Emergency Department
Admission: EM | Admit: 2021-02-17 | Discharge: 2021-02-17 | Disposition: A | Discharge status: Home / Self Care | Payer: BC Managed Care – PPO | Attending: Emergency Medicine | Admitting: Emergency Medicine

## 2021-02-17 ENCOUNTER — Other Ambulatory Visit: Payer: Self-pay

## 2021-02-17 ENCOUNTER — Emergency Department: Payer: BC Managed Care – PPO

## 2021-02-17 DIAGNOSIS — S0993XA Unspecified injury of face, initial encounter: Secondary | ICD-10-CM | POA: Diagnosis present

## 2021-02-17 DIAGNOSIS — E119 Type 2 diabetes mellitus without complications: Secondary | ICD-10-CM | POA: Diagnosis not present

## 2021-02-17 DIAGNOSIS — I10 Essential (primary) hypertension: Secondary | ICD-10-CM | POA: Insufficient documentation

## 2021-02-17 DIAGNOSIS — Y92410 Unspecified street and highway as the place of occurrence of the external cause: Secondary | ICD-10-CM | POA: Insufficient documentation

## 2021-02-17 DIAGNOSIS — S0181XA Laceration without foreign body of other part of head, initial encounter: Secondary | ICD-10-CM | POA: Diagnosis not present

## 2021-02-17 DIAGNOSIS — S01111A Laceration without foreign body of right eyelid and periocular area, initial encounter: Secondary | ICD-10-CM | POA: Insufficient documentation

## 2021-02-17 MED ORDER — KETOROLAC TROMETHAMINE 30 MG/ML IJ SOLN: 30.0000 mg | Freq: Once | INTRAMUSCULAR | Status: DC

## 2021-02-17 MED ORDER — LIDOCAINE-EPINEPHRINE-TETRACAINE (LET) TOPICAL GEL
3.0000 mL | Freq: Once | TOPICAL | Status: DC
  Filled 2021-02-17: qty 3

## 2021-02-17 MED ORDER — CEPHALEXIN 500 MG PO CAPS: 500.0000 mg | ORAL_CAPSULE | Freq: Four times a day (QID) | ORAL | 0 refills | Status: AC

## 2021-02-17 NOTE — Discharge Instructions (Addendum)
Take Keflex four times daily for the next seven days.  Have sutures removed in five days.
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
## 2023-02-09 ENCOUNTER — Emergency Department
Admission: EM | Admit: 2023-02-09 | Discharge: 2023-02-09 | Disposition: A | Discharge status: Home / Self Care | Payer: BC Managed Care – PPO | Attending: Emergency Medicine | Admitting: Emergency Medicine

## 2023-02-09 ENCOUNTER — Emergency Department: Payer: BC Managed Care – PPO

## 2023-02-09 ENCOUNTER — Encounter: Payer: Self-pay | Admitting: Emergency Medicine

## 2023-02-09 ENCOUNTER — Other Ambulatory Visit: Payer: Self-pay

## 2023-02-09 DIAGNOSIS — R109 Unspecified abdominal pain: Secondary | ICD-10-CM | POA: Diagnosis not present

## 2023-02-09 DIAGNOSIS — I10 Essential (primary) hypertension: Secondary | ICD-10-CM | POA: Diagnosis not present

## 2023-02-09 DIAGNOSIS — W102XXA Fall (on)(from) incline, initial encounter: Secondary | ICD-10-CM | POA: Diagnosis not present

## 2023-02-09 DIAGNOSIS — W19XXXA Unspecified fall, initial encounter: Secondary | ICD-10-CM

## 2023-02-09 DIAGNOSIS — S2231XA Fracture of one rib, right side, initial encounter for closed fracture: Secondary | ICD-10-CM | POA: Insufficient documentation

## 2023-02-09 DIAGNOSIS — M546 Pain in thoracic spine: Secondary | ICD-10-CM | POA: Diagnosis not present

## 2023-02-09 DIAGNOSIS — Y9389 Activity, other specified: Secondary | ICD-10-CM | POA: Diagnosis not present

## 2023-02-09 DIAGNOSIS — E1165 Type 2 diabetes mellitus with hyperglycemia: Secondary | ICD-10-CM | POA: Insufficient documentation

## 2023-02-09 DIAGNOSIS — M542 Cervicalgia: Secondary | ICD-10-CM | POA: Diagnosis not present

## 2023-02-09 DIAGNOSIS — R519 Headache, unspecified: Secondary | ICD-10-CM | POA: Insufficient documentation

## 2023-02-09 DIAGNOSIS — R739 Hyperglycemia, unspecified: Secondary | ICD-10-CM

## 2023-02-09 DIAGNOSIS — R0789 Other chest pain: Secondary | ICD-10-CM | POA: Diagnosis present

## 2023-02-09 LAB — CBC WITH DIFFERENTIAL/PLATELET
Abs Immature Granulocytes: 0.05 10*3/uL (ref 0.00–0.07)
Basophils Absolute: 0.1 10*3/uL (ref 0.0–0.1)
Basophils Relative: 1 %
Eosinophils Absolute: 0.2 10*3/uL (ref 0.0–0.5)
Eosinophils Relative: 2 %
HCT: 43.7 % (ref 39.0–52.0)
Hemoglobin: 15.1 g/dL (ref 13.0–17.0)
Immature Granulocytes: 0 %
Lymphocytes Relative: 15 %
Lymphs Abs: 1.7 10*3/uL (ref 0.7–4.0)
MCH: 31.4 pg (ref 26.0–34.0)
MCHC: 34.6 g/dL (ref 30.0–36.0)
MCV: 90.9 fL (ref 80.0–100.0)
Monocytes Absolute: 0.9 10*3/uL (ref 0.1–1.0)
Monocytes Relative: 8 %
Neutro Abs: 8.7 10*3/uL — ABNORMAL HIGH (ref 1.7–7.7)
Neutrophils Relative %: 74 %
Platelets: 293 10*3/uL (ref 150–400)
RBC: 4.81 MIL/uL (ref 4.22–5.81)
RDW: 12.7 % (ref 11.5–15.5)
WBC: 11.6 10*3/uL — ABNORMAL HIGH (ref 4.0–10.5)
nRBC: 0 % (ref 0.0–0.2)

## 2023-02-09 LAB — BASIC METABOLIC PANEL
Anion gap: 11 (ref 5–15)
BUN: 14 mg/dL (ref 6–20)
CO2: 26 mmol/L (ref 22–32)
Calcium: 9 mg/dL (ref 8.9–10.3)
Chloride: 94 mmol/L — ABNORMAL LOW (ref 98–111)
Creatinine, Ser: 0.88 mg/dL (ref 0.61–1.24)
GFR, Estimated: >60 mL/min (ref >60)
Glucose, Bld: 425 mg/dL — ABNORMAL HIGH (ref 70–99)
Potassium: 4 mmol/L (ref 3.5–5.1)
Sodium: 131 mmol/L — ABNORMAL LOW (ref 135–145)

## 2023-02-09 LAB — CBG MONITORING, ED: Glucose-Capillary: 332 mg/dL — ABNORMAL HIGH (ref 70–99)

## 2023-02-09 MED ORDER — IOHEXOL 300 MG/ML  SOLN
100.0000 mL | Freq: Once | INTRAMUSCULAR | Status: AC | PRN
  Administered 2023-02-09: 100 mL via INTRAVENOUS

## 2023-02-09 MED ORDER — MORPHINE SULFATE (PF) 4 MG/ML IV SOLN
4.0000 mg | Freq: Once | INTRAVENOUS | Status: AC
  Administered 2023-02-09: 4 mg via INTRAVENOUS
  Filled 2023-02-09: qty 1

## 2023-02-09 MED ORDER — CYCLOBENZAPRINE HCL 5 MG PO TABS: 5.0000 mg | ORAL_TABLET | Freq: Three times a day (TID) | ORAL | 0 refills | Status: AC | PRN

## 2023-02-09 MED ORDER — LACTATED RINGERS IV BOLUS
1000.0000 mL | Freq: Once | INTRAVENOUS | Status: AC
  Administered 2023-02-09: 1000 mL via INTRAVENOUS

## 2023-02-09 MED ORDER — OXYCODONE-ACETAMINOPHEN 5-325 MG PO TABS: 1.0000 | ORAL_TABLET | ORAL | 0 refills | Status: AC | PRN

## 2023-02-09 MED ORDER — LIDOCAINE 5 % EX PTCH: 1.0000 | MEDICATED_PATCH | Freq: Two times a day (BID) | CUTANEOUS | 0 refills | Status: AC
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
